# Patient Record
Sex: Female | Born: 1955 | Race: White | Hispanic: No | Marital: Married | Smoking: Former smoker
Health system: Southern US, Community
[De-identification: ages and names within clinical notes are randomized; demographics above are authoritative.]

## PROBLEM LIST (undated history)

## (undated) DIAGNOSIS — K5790 Diverticulosis of intestine, part unspecified, without perforation or abscess without bleeding: Secondary | ICD-10-CM

## (undated) DIAGNOSIS — F329 Major depressive disorder, single episode, unspecified: Secondary | ICD-10-CM

## (undated) DIAGNOSIS — G43909 Migraine, unspecified, not intractable, without status migrainosus: Secondary | ICD-10-CM

## (undated) DIAGNOSIS — R002 Palpitations: Secondary | ICD-10-CM

## (undated) DIAGNOSIS — C4491 Basal cell carcinoma of skin, unspecified: Secondary | ICD-10-CM

## (undated) DIAGNOSIS — I2510 Atherosclerotic heart disease of native coronary artery without angina pectoris: Secondary | ICD-10-CM

## (undated) DIAGNOSIS — G453 Amaurosis fugax: Secondary | ICD-10-CM

## (undated) DIAGNOSIS — K760 Fatty (change of) liver, not elsewhere classified: Secondary | ICD-10-CM

## (undated) DIAGNOSIS — E039 Hypothyroidism, unspecified: Secondary | ICD-10-CM

## (undated) DIAGNOSIS — I10 Essential (primary) hypertension: Secondary | ICD-10-CM

## (undated) DIAGNOSIS — M1990 Unspecified osteoarthritis, unspecified site: Secondary | ICD-10-CM

## (undated) DIAGNOSIS — K589 Irritable bowel syndrome without diarrhea: Secondary | ICD-10-CM

## (undated) DIAGNOSIS — F419 Anxiety disorder, unspecified: Secondary | ICD-10-CM

## (undated) DIAGNOSIS — I639 Cerebral infarction, unspecified: Secondary | ICD-10-CM

## (undated) DIAGNOSIS — F32A Depression, unspecified: Secondary | ICD-10-CM

## (undated) DIAGNOSIS — K219 Gastro-esophageal reflux disease without esophagitis: Secondary | ICD-10-CM

## (undated) HISTORY — DX: Amaurosis fugax: G45.3

## (undated) HISTORY — DX: Major depressive disorder, single episode, unspecified: F32.9

## (undated) HISTORY — DX: Anxiety disorder, unspecified: F41.9

## (undated) HISTORY — DX: Gastro-esophageal reflux disease without esophagitis: K21.9

## (undated) HISTORY — DX: Atherosclerotic heart disease of native coronary artery without angina pectoris: I25.10

## (undated) HISTORY — DX: Cerebral infarction, unspecified: I63.9

## (undated) HISTORY — DX: Palpitations: R00.2

## (undated) HISTORY — DX: Unspecified osteoarthritis, unspecified site: M19.90

## (undated) HISTORY — DX: Migraine, unspecified, not intractable, without status migrainosus: G43.909

## (undated) HISTORY — DX: Diverticulosis of intestine, part unspecified, without perforation or abscess without bleeding: K57.90

## (undated) HISTORY — PX: TUBAL LIGATION: SHX77

## (undated) HISTORY — PX: CHOLECYSTECTOMY: SHX55

## (undated) HISTORY — DX: Essential (primary) hypertension: I10

## (undated) HISTORY — DX: Basal cell carcinoma of skin, unspecified: C44.91

## (undated) HISTORY — PX: SHOULDER SURGERY: SHX246

## (undated) HISTORY — PX: CHOLECYSTECTOMY, LAPAROSCOPIC: SHX56

## (undated) HISTORY — DX: Hypothyroidism, unspecified: E03.9

## (undated) HISTORY — DX: Depression, unspecified: F32.A

## (undated) HISTORY — DX: Irritable bowel syndrome, unspecified: K58.9

---

## 1998-02-12 DIAGNOSIS — C4491 Basal cell carcinoma of skin, unspecified: Secondary | ICD-10-CM

## 1998-02-12 HISTORY — DX: Basal cell carcinoma of skin, unspecified: C44.91

## 1998-08-02 ENCOUNTER — Ambulatory Visit (HOSPITAL_COMMUNITY): Admission: RE | Admit: 1998-08-02 | Discharge: 1998-08-02 | Payer: Self-pay | Admitting: *Deleted

## 2000-02-13 HISTORY — PX: CARDIAC CATHETERIZATION: SHX172

## 2000-09-29 ENCOUNTER — Encounter: Payer: Self-pay | Admitting: *Deleted

## 2000-09-29 ENCOUNTER — Inpatient Hospital Stay (HOSPITAL_COMMUNITY): Admission: EM | Admit: 2000-09-29 | Discharge: 2000-10-03 | Payer: Self-pay | Admitting: *Deleted

## 2000-10-01 ENCOUNTER — Encounter: Payer: Self-pay | Admitting: *Deleted

## 2002-10-15 ENCOUNTER — Encounter: Payer: Self-pay | Admitting: Occupational Therapy

## 2002-10-15 ENCOUNTER — Ambulatory Visit (HOSPITAL_COMMUNITY): Admission: RE | Admit: 2002-10-15 | Discharge: 2002-10-15 | Payer: Self-pay | Admitting: Occupational Therapy

## 2005-07-12 ENCOUNTER — Ambulatory Visit (HOSPITAL_COMMUNITY): Admission: RE | Admit: 2005-07-12 | Discharge: 2005-07-12 | Payer: Self-pay | Admitting: Nurse Practitioner

## 2005-07-31 ENCOUNTER — Ambulatory Visit (HOSPITAL_COMMUNITY): Admission: RE | Admit: 2005-07-31 | Discharge: 2005-07-31 | Payer: Self-pay | Admitting: Nurse Practitioner

## 2010-02-28 ENCOUNTER — Ambulatory Visit
Admission: RE | Admit: 2010-02-28 | Discharge: 2010-02-28 | Payer: Self-pay | Source: Home / Self Care | Attending: Gastroenterology | Admitting: Gastroenterology

## 2010-02-28 ENCOUNTER — Encounter: Payer: Self-pay | Admitting: Internal Medicine

## 2010-02-28 DIAGNOSIS — R109 Unspecified abdominal pain: Secondary | ICD-10-CM | POA: Insufficient documentation

## 2010-02-28 DIAGNOSIS — R197 Diarrhea, unspecified: Secondary | ICD-10-CM | POA: Insufficient documentation

## 2010-03-02 ENCOUNTER — Encounter (INDEPENDENT_AMBULATORY_CARE_PROVIDER_SITE_OTHER): Payer: Self-pay

## 2010-03-02 ENCOUNTER — Encounter: Payer: Self-pay | Admitting: Internal Medicine

## 2010-03-03 ENCOUNTER — Ambulatory Visit (HOSPITAL_COMMUNITY): Admission: RE | Admit: 2010-03-03 | Payer: Self-pay | Source: Home / Self Care | Admitting: Internal Medicine

## 2010-03-10 ENCOUNTER — Ambulatory Visit (HOSPITAL_COMMUNITY): Admission: RE | Admit: 2010-03-10 | Payer: Self-pay | Source: Home / Self Care | Admitting: Internal Medicine

## 2010-03-13 ENCOUNTER — Telehealth (INDEPENDENT_AMBULATORY_CARE_PROVIDER_SITE_OTHER): Payer: Self-pay | Admitting: *Deleted

## 2010-03-13 ENCOUNTER — Encounter (INDEPENDENT_AMBULATORY_CARE_PROVIDER_SITE_OTHER): Payer: Self-pay | Admitting: *Deleted

## 2010-03-15 ENCOUNTER — Encounter: Payer: Self-pay | Admitting: Internal Medicine

## 2010-03-16 NOTE — Letter (Signed)
Summary: OP REPORT FROM 03/25/98  OP REPORT FROM 03/25/98   Imported By: Rexene Alberts 03/02/2010 12:12:55  _____________________________________________________________________  External Attachment:    Type:   Image     Comment:   External Document

## 2010-03-16 NOTE — Assessment & Plan Note (Addendum)
Summary: chronic diarrhea with unconfirmed ibs/ss   Visit Type:  Initial Consult Referring Provider:  Lenise Herald, PA Primary Care Provider:  Lenise Herald, PA  CC:  diarrhea.  History of Present Illness: Diamond Mills is a 55 year old Caucasian female who presents today at the request of Lenise Herald, secondary to diarrhea. She has undergone an EGD and colonoscopy by Dr. Jena Gauss in the remote past, around 2000. EGD in Jan 2000 was normal with negative CLOtest. Colonoscopy in 2000 showed anal canal/internal hemorrhoids, normal colon.   Reports chronic right-sided abdominal pain, intermittent, no gallbladder X 2 years. Feels like someone has rolled a cloth into a roll and sitting up under ribcage. Not associated with eating/drinking. Was given Cipro for three days, pain got better. Feels stress-related, which worsens pain. 8/10 when hits. lasts 2-3 days. +hx of reflux, omeprazole controls reflux. Spicy food exacerbates if eats alot. No dysphagia. Was told at [facility] that had IBS-D. Can have BM 2-6X per day. Is on Bentyl, which has helped considerably with diarrhea, but continues to have. Normally postprandially, 15-20 minutes after eating. No abdominal cramping. Has had loose stools "whole life". Has hx of hemorrhoids, notices sparse amounts of bright red blood intermittently. scant amount.   Current Medications (verified): 1)  Amlodipine Besylate 5 Mg Tabs (Amlodipine Besylate) .... Take 1 Tablet By Mouth Once A Day 2)  Lisinopril 20 Mg Tabs (Lisinopril) .... Take 1 Tablet By Mouth Once A Day 3)  Aspir-Low 81 Mg Tbec (Aspirin) .... Take 1 Tablet By Mouth Once A Day 4)  Levothyroxine Sodium 75 Mcg Tabs (Levothyroxine Sodium) .... Take 1 Tablet By Mouth Once A Day 5)  Bentyl 10 Mg Caps (Dicyclomine Hcl) .... One Tablet At Bedtime 6)  Omeprazole 20 Mg Cpdr (Omeprazole) .... One Tablet Daily  Allergies (verified): 1)  ! Penicillin 2)  ! Sulfa 3)  ! * E-Mycin 4)  ! Vancomycin 5)  ! * Ivp Dye  Past  History:  Past Medical History: HTN GERD IBS-D likely hypothyroidism hx CVA CAD: 5 heart caths hx PUD , EGD in [place] in late 1980s per medical record reports  EGD/colon in 2000: EGD normal, anal canal/internal hemorrhoids, otherwise normal  Past Surgical History: Cardiac Cath X 5 tubal sterilization cholecystectomy shoulder surgery (spur)  Family History: Mother:deceased, DM, schizophrenia, obese Father:unknown, was adopted Siblings, 2 sisters: one triple bypass, IBS, HTN No FH of Colon Cancer:  Social History: Occupation: CNA at [employer], 2 children, 1 deceased, 4 grandchildren Patient is a former smoker. quit 1991, used to smoke 2ppd.  Alcohol Use - no Illicit Drug Use - no Smoking Status:  quit Drug Use:  no  Review of Systems General:  Denies fever, chills, and anorexia. Eyes:  Denies blurring, irritation, and discharge. ENT:  Denies sore throat, hoarseness, and difficulty swallowing. CV:  Denies chest pains and syncope. Resp:  Denies dyspnea at rest and wheezing. GI:  Complains of abdominal pain and diarrhea; denies difficulty swallowing, pain on swallowing, nausea, indigestion/heartburn, constipation, change in bowel habits, bloody BM's, and black BMs. GU:  Denies urinary burning and urinary frequency. MS:  Denies joint pain / LOM, joint swelling, and joint stiffness. Derm:  Denies rash, itching, and dry skin. Neuro:  Denies weakness and syncope. Psych:  Denies depression and anxiety. Endo:  Denies cold intolerance and heat intolerance. Heme:  Denies bruising and bleeding.  Vital Signs:  Patient profile:   55 year old female Height:      65 inches Weight:  219 pounds BMI:     36.58 Temp:     98.7 degrees F oral Pulse rate:   72 / minute BP sitting:   130 / 80  (left arm) Cuff size:   large  Vitals Entered By: Cloria Spring LPN (February 28, 2010 1:58 PM)  Physical Exam  General:  Well developed, well nourished, no acute distress. Head:   Normocephalic and atraumatic. Eyes:  sclera without icterus Mouth:  No deformity or lesions, dentition normal. Neck:  Supple; no masses or thyromegaly. Lungs:  Clear throughout to auscultation. Heart:  Regular rate and rhythm; no murmurs, rubs,  or bruits. Abdomen:  +BS, non-distended, right-sided abdomen region. No rebound or guarding, no HSM noted.  Msk:  Symmetrical with no gross deformities. Normal posture. Pulses:  Normal pulses noted. Extremities:  No clubbing, cyanosis, edema or deformities noted. Neurologic:  Alert and  oriented x4;  grossly normal neurologically. Psych:  Alert and cooperative. Normal mood and affect.  Impression & Recommendations:  Problem # 1:  ABDOMINAL PAIN (ICD-789.00) Chronic right-sided abdominal pain, intermittent, no associated with eating/drinking. stress worsens discomfort. 8/10 at worst, lasts 2-3 days at at time. exacerbated by spicy foods as well. Denies reflux, which is controlled by omeprazole. Denies dysphagia. s/p chole. Diff include gastritis vs PUD, does have remote hx of PUD. Low likely hepatobiliary component. ?functional abdominal pain.    CBC, CMP [place] of abdomen EGD may need to be added after review of above.    Orders: T-CBC w/Diff ([id number]) T-Comprehensive Metabolic Panel [id number]) Consultation Level III (29562)  Problem # 2:  DIARRHEA (ICD-787.91) Long-standing hx of reportedly IBS, diarrhea predominent. Diagnosed at [facility] per pt. Currently on Bentyl, which she states helps significantly with postprandial loose stool. No abdominal cramping. hx of hemorrhois, but has noticed sparse amounts of brbpr intermittently. Last TCS in 2000 normal except internal and anal canal hemorrhoids. Likely low-volume hematochezia secondary to benign anorectal process; yet, is due for a screening colonoscopy. occult malignancy can't be excluded.   Add supplemental fiber to diet TCS with RMR: the risks, benefits, alternatives have been discussed  in detail with pt; she stated understanding, and has given verbal consent.   Orders: T-CBC w/Diff ([id number]) T-Comprehensive Metabolic Panel [phone]) Consultation Level III (24401)  Appended Document: chronic diarrhea with unconfirmed ibs/ss letter has been sent to pt regarding labs and need for ultrasound. cancelled procedure without informing our office.

## 2010-03-16 NOTE — Letter (Signed)
Summary: TCS ORDERS  TCS ORDERS   Imported By: Rexene Alberts 02/28/2010 15:55:24  _____________________________________________________________________  External Attachment:    Type:   Image     Comment:   External Document

## 2010-03-16 NOTE — Letter (Signed)
Summary: REFERRAL FROM [facility]  REFERRAL FROM [facility]   Imported By: Rexene Alberts 03/02/2010 13:48:36  _____________________________________________________________________  External Attachment:    Type:   Image     Comment:   External Document

## 2010-03-16 NOTE — Letter (Addendum)
Summary: ABD U/S ORDER  ABD U/S ORDER   Imported By: Ave Filter 02/28/2010 15:15:54  _____________________________________________________________________  External Attachment:    Type:   Image     Comment:   External Document  Appended Document: ABD U/S ORDER Pt NO SHOWED for her u/s...  Appended Document: ABD U/S ORDER I see she did not complete her colonoscopy, either. We need to send her a letter to not neglect her health.   Appended Document: ABD U/S ORDER Mailed letter to pt.

## 2010-03-16 NOTE — Letter (Signed)
Summary: Plan of Care, Need to Discuss  [facility]  [address]   [place], [place] [address]   Phone: [phone]  Fax: [phone]    March 02, 2010  Diamond Mills [address], [place]  [address] May 04, 1955   Dear Ms. Diamond Mills,   We received a call from the hospital that you had called to cancel your  appointment on 03/10/2009 @ 9:00 AM.  We would like for you to call our office and let us know, since that is the way we handle cancellations. I was unable to call you at the  phone number listed on your chart. Please call us at [phone] and let us know what your plans are. Look forward to hearing from you soon.  Please do not neglect your health.   Sincerely,    Cloria Spring LPN  [facility] Ph: [phone]    Fax: [phone]

## 2010-03-22 NOTE — Progress Notes (Signed)
Summary: pt was a no show for her tcs  Phone Note From Other Clinic   Summary of Call: Shawna Orleans from Short Stay called this morning to let us know that pt do not show up for her TCS on Friday 03/10/10 and that the home number was disconnected.   Initial call taken by: Diana Eves,  March 13, 2010 9:38 AM     Appended Document: pt was a no show for her tcs I tried to call pt, phone number listed is disconnected..I mailed letter to pt.

## 2010-03-22 NOTE — Letter (Signed)
Summary: Radiology Test Reminder  [facility]  [address]   [place], [place] [address]   Phone: [phone]  Fax: [phone]     March 13, 2010   Diamond Mills [address] [place], [place]  [address] 12-Aug-1955  Dear Ms. Diamond Mills,  During your last appointment, your doctor requested you have a Colonoscopy.  Our records indicate you have not had this done.  Remember it is very important to follow your doctor's instructions.  Please have this done as soon as possible.  If you have questions regarding this appointment, please call our office and we can reschedule this for you.  It is important that patients and their doctor work together in the management and treatment of their health care.  If you have already had your test done, please disregard this letter.  Thank you,    Ave Filter  [facility] Ph: [phone]   Fax: [phone]

## 2010-04-17 ENCOUNTER — Other Ambulatory Visit (HOSPITAL_COMMUNITY): Payer: Self-pay | Admitting: Internal Medicine

## 2010-04-17 ENCOUNTER — Ambulatory Visit (HOSPITAL_COMMUNITY)
Admission: RE | Admit: 2010-04-17 | Discharge: 2010-04-17 | Disposition: A | Discharge status: Home / Self Care | Payer: 59 | Source: Ambulatory Visit | Attending: Internal Medicine | Admitting: Internal Medicine

## 2010-04-17 DIAGNOSIS — S298XXA Other specified injuries of thorax, initial encounter: Secondary | ICD-10-CM | POA: Insufficient documentation

## 2010-04-17 DIAGNOSIS — R0789 Other chest pain: Secondary | ICD-10-CM | POA: Insufficient documentation

## 2010-04-17 DIAGNOSIS — W19XXXA Unspecified fall, initial encounter: Secondary | ICD-10-CM | POA: Insufficient documentation

## 2010-04-17 DIAGNOSIS — Q766 Other congenital malformations of ribs: Secondary | ICD-10-CM

## 2010-04-25 ENCOUNTER — Encounter (INDEPENDENT_AMBULATORY_CARE_PROVIDER_SITE_OTHER): Payer: Self-pay | Admitting: *Deleted

## 2010-05-02 NOTE — Letter (Signed)
Summary: Radiology Test Reminder  [facility]  [address]   [place], [place] [address]   Phone: [phone]  Fax: [phone]     April 25, 2010   Diamond Mills [address], [place]  [address] [birth year]  Dear Diamond Mills,  During your last appointment, your doctor requested you have an Ultrasound.  Our records indicate you have not had this done.  Remember it is very important to follow your doctor's instructions.  Please have this done as soon as possible.  If you have questions regarding this appointment, please call our office and we can reschedule this for you.  It is important that patients and their doctor work together in the management and treatment of their health care.  If you have already had your test done, please disregard this letter.  Thank you,    Ave Filter  [facility] Ph: [phone]   Fax: [phone]

## 2010-06-30 NOTE — Cardiovascular Report (Signed)
[place]. [facility]  Patient:    Diamond Mills, Diamond Mills Visit Number: [id number] MRN: [id number]          Service Type: MED Location: [phone] Attending Physician:  Nelta Numbers Proc. Date: 10/02/00 Adm. Date:  09/29/2000   CC:         Kari Baars, M.D., Angelita Ingles C. Wall, M.D. [facility]   Cardiac Catheterization  PROCEDURES PERFORMED: 1. Left heart catheterization. 2. Left ventriculogram. 3. Selective coronary angiography.  DIAGNOSES: 1. Mild coronary artery disease by angiogram. 2. Normal left ventricular systolic function.  INDICATIONS:  The patient is a 55 year old, white female, who presents with substernal chest discomfort.  The patient was admitted to the hospital and subsequently ruled out for acute myocardial infarction.  She underwent a stress imaging study during which time she had chest discomfort.  Imaging study showed no ischemia.  Due to persistent symptoms, she presents for cardiac catheterization.  TECHNIQUE:  After informed consent was obtained, the patient was brought to the cardiac catheterization lab.  A 6 French sheath was placed in the right femoral artery.  Left heart catheterization and selective angiography were then performed in the usual fashion using preformed 6 French Judkins catheters.  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 At the termination of the case, the catheters and sheath were removed and manual pressure applied until adequate hemostasis was achieved.  The patient tolerated the procedure well and was transferred to the ward in stable condition.  FINDINGS:  Findings are as follows: 1. Left main trunk:  The left main trunk is a large caliber vessel with    mild irregularities. 2. LAD:  This is a large caliber vessel that provides the major first    diagonal branch in the proximal segment.  The  LAD proper has mild    irregularities.  The first diagonal branch has an ostial narrowing of    50-60% with a further narrowing of 50% in the proximal segment. 3. Left circumflex artery:  The left circumflex artery is a large caliber    vessel that provides two marginal branches in the mid section.  Left    circumflex system has mild irregularities. 4. Ramus intermedius:  This is a medium caliber vessel with mild    irregularities. 5. Right coronary artery:  The right coronary artery is dominant. This is a    medium caliber vessel that provides a small posterior descending and    posterior ventricular branch in its terminal segment.  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  It has    mild narrowing of 30% in the proximal segment.  The remainder of the    vessel is free of significant disease.  LEFT VENTRICULOGRAM:  Normal end-systolic and end-diastolic dimensions. Overall left ventricular function is well preserved, ejection fraction of greater than 55%.  No mitral regurgitation.  LV pressure is 180/0, aortic is 180/90, LVEDP equals 30.  ASSESSMENT AND PLAN:  The patient is a 55 year old female, who presents with noncritical coronary artery disease.  Review of catheterization films from August 01, 2000, shows no progression of disease in the diagonal branch.  At this point continued medical therapy will be recommended. Attending Physician:  Nelta Numbers DD:  10/02/00 TD:  10/03/00 Job: [id number]

## 2010-06-30 NOTE — Consult Note (Signed)
[place]. [facility]  Patient:    Diamond Mills, Diamond Mills Visit Number: [id number] MRN: [id number]          Service Type: MED Location: [phone] Attending Physician:  Nelta Numbers Dictated by:   Kelli Hope, M.D. Proc. Date: 10/03/00 Adm. Date:  09/29/2000 Disc. Date: 10/03/2000   CC:         Jesse Sans. Wall, M.D. [facility]   Consultation Report  DATE OF BIRTH:  March 08, 1955  REQUESTING PHYSICIAN:  Jesse Sans. Wall, M.D. [facility]  REASON FOR EVALUATION:  Left eye visual loss.  HISTORY OF PRESENT ILLNESS:  This is the initial inpatient consultation evaluation of this 55 year old woman with a past medical history including hypertension, who was admitted on August 18 for an episode of chest pain.  She had a negative stress Cardiolite but had an episode of nonsustained ventricular tachycardia during her hospitalization and subsequently underwent diagnostic cardiac catheterization yesterday which showed only minimal cardiac disease.  The patient reports that she vaguely remembers rubbing her left eye after the procedure as if something was wrong with it, although she was not really aware of any visual changes until sometime after the procedure when her sedation wore off.  At that point, she noted that she seemed to have a "grey spot" in her left eye which blocked out her center of vision.  She has had a little bit of improvement in this today in that the spot is a little smaller, and the grey is now a pinkish color.  She is also able to perceive some light in this are.  The peripheral vision is fine.  She has no difficulty seeing out of the right eye.  She notes that she sees a little better out of the left eye when she lies supine.  For what looked like embolic event, her heparin was restarted last night.  She denies ever having any similar events before and to her knowledge has never had a stroke.  PAST MEDICAL HISTORY:  Remarkable for hypertension  which has been well controlled in the hospital.  No known history of diabetes, stroke, or coronary artery disease.  She did have mild one-vessel disease on previous outside catheterization that was confirmed on the study yesterday.  FAMILY HISTORY:  Mother died at 78 of heart disease.  She has two daughters, one of which has epilepsy.  SOCIAL HISTORY:  She is married and lives with her husband.  She is normally independent in her activities of daily living.  She has not smoked in 10 years.  ALLERGIES:  No known drug allergies.  MEDICATIONS AT HOME:  Aceon, Toprol, aspirin, and nitroglycerin.  MEDICATIONS IN HOSPITAL:  She is also receiving Colace, Protonix, potassium, Pepcid, and heparin; and the Aceon has been changed to Norvasc.  REVIEW OF SYSTEMS:  Per HPI.  There was no associated dysarthria or dysphasia. There were no weakness or sensory changes in the extremities associated with the above event.  PHYSICAL EXAMINATION:  VITAL SIGNS:  Temperature 96.8, blood pressure 121/60, pulse 65, respirations 20.  GENERAL:  She is in no evident distress.  NEUROLOGIC:  Mental Status: She is awake, alert, and completely oriented. Mood is euthymic, and affect is appropriate.  Speech is fluent and not dysarthric.  Cranial Nerves:  Funduscopic exam is benign.  Pupils are equal and briskly reactive, and there is no afferent pupillary defect.  Extraocular movements are normal without nystagmus.  Visual fields tested independently in each eye reveal  on the left side only a central scotoma involving more of the inferior than superior field.  Peripheral vision is intact.  Facial sensation is intact to pinprick.  Face, tongue, and palate all move normally in the midline.  Motor Exam shows normal bulk and tone.  Normal strength in all tested extremity muscles.  Sensation is intact to pinprick and light touch in all extremities.  Reflexes are 2+ and symmetric.  Toes are  downgoing. Finger-to-nose is performed full and well.  IMPRESSION:  Left eye visual loss secondary to a vascular event, almost certainly an embolic event related to cardiac catheterization.  She has had some improvement since yesterday.  Need to rule out carotid disease.  RECOMMENDATIONS: 1. Check carotid Dopplers before discharge. 2. Okay to discontinue heparin. 3. Would let blood pressure be a little elevated over the next one to two    weeks, especially as long as she notices a postural difference in her    vision. 4. May patch eye for symptomatic relief as needed, although not all the time. 5. Outpatient ophthalmology followup with serial visual field testing would    be helpful.  She is likely to have improvement, although she may never    regain her central vision completely.  Thank you for the consult. Dictated by:   Kelli Hope, M.D. Attending Physician:  Nelta Numbers DD:  10/03/00 TD:  10/04/00 Job: [id number]

## 2010-06-30 NOTE — Discharge Summary (Signed)
[place]. [facility]  Patient:    Diamond Mills, Diamond Mills Visit Number: [id number] MRN: [id number]          Service Type: Attending:  Gerrit Friends. Dietrich Pates, M.D. [facility] Dictated by:   Rozell Searing, P.A. Adm. Date:  09/29/00 Disc. Date: 10/04/00   CC:         Diamond Mills, M.D.  Diamond Mills, M.D.   Referring Physician Discharge [facility]  PROCEDURES: 1. Exercise Cardiolite on October 01, 2000. 2. Cardiac catheterization on October 02, 2000. 3. Carotid Dopplers on October 03, 2000.  REASON FOR ADMISSION:  The patient is a 55 year old female with a history of nonobstructive CAD, who presented with complaint of chest pain with bilateral arm radiation.  She was admitted for rule out of MI and further diagnostic evaluation.  LABORATORY DATA:  Cardiac enzymes:  CPK-MB negative x 3, troponin I 0.01 x 2. Lipid profile:  Total cholesterol 153, triglycerides 130, HDL 43, LDL 84, total cholesterol/HDL ratio 3.6.  Metabolic profile notable for low potassium of 3.3 - follow-up 4.4.  Normal renal function.  Mildly decreased albumin 3.3. Normal liver enzymes.  Normal magnesium.  Normal serial CBCs.  Admission CXR:  No acute disease.  HOSPITAL COURSE:  The patient ruled out for MI with negative serial cardiac enzymes.  The initial plan was to proceed with noninvasive stress testing. The patient underwent adenosine Cardiolite testing which revealed mild septal hypokinesis with no evidence of ischemia; EF 52%.  However, serial EKG tracings did reveal inferolateral ST depression. Following review with E. Graceann Congress, M.D., the recommendation was to proceed with coronary angiography.  Cardiac catheterization performed on October 02, 2000, by Veneda Melter, M.D., (see catheterization report for full details) revealed nonobstructive CAD with normal LV function.  Specifically, there was ostial 50-60% ostial, 50-60% proximal DX1, mild OM disease, mild ramus intermedius disease, and 30%  proximal RCA (dominant).  LV function normal with no mitral regurgitation.  Veneda Melter, M.D., concluded that the chest pain was noncardiac in origin and recommended continued medical therapy.  However, following the intervention, the patient reported left eye visual loss.  She was kept on heparin pending evaluation by neurology.  The patient was seen by Diamond Mills, M.D., who evaluated carotid Dopplers, which showed no significant ICA stenosis.  He agreed to discontinue the heparin and recommended maintaining the blood pressure a little on the high side over the next one to two weeks, especially as long as she notes postural differences, he stated.  He also recommended that the patient see an ophthalmologist in follow-up.  Following this evaluation, the patient was cleared for discharge.  Of note, review of telemetry revealed an 11-beat run of wide complex tachycardia at approximately 160 bpm.  Both the potassium and magnesium were within normal limits by the time of discharge.  MEDICATIONS AT DISCHARGE: 1. Aceon 4 mg q.d. 2. Toprol XL 50 mg q.d. 3. Coated aspirin 325 mg q.d. 4. Nitro-Dur patch 0.2 mg/hr q.d. as directed. 5. Norvasc 5 mg q.d. 6. Protonix 40 mg q.d. 7. Nitrostat p.r.n.  DISCHARGE INSTRUCTIONS:  The patient is instructed to schedule a follow-up appointment with Maisie Fus C. Wall, M.D., in six months.  She is also encouraged to proceed with an outpatient ophthalmology follow-up as ordered per neurology.  DISCHARGE DIAGNOSES: 1. Noncardiac chest pain.    a. Negative serial cardiac enzymes.    b. Mild coronary artery disease/normal left ventricular function by cardiac       catheterization on October 02, 2000.  c. Post catheterization left eye visual loss.  Negative evaluation by       neurology. 2. Nonsustained ventricular tachycardia 3. History of hypertension. 4. Remote history of peptic ulcer disease. Dictated by:   Rozell Searing, P.A. Attending:  Gerrit Friends. Dietrich Pates, M.D. [facility] DD:  10/03/00 TD:  10/04/00 Job: [id number]

## 2010-11-16 ENCOUNTER — Ambulatory Visit (INDEPENDENT_AMBULATORY_CARE_PROVIDER_SITE_OTHER): Payer: 59 | Admitting: Gastroenterology

## 2010-11-16 ENCOUNTER — Encounter: Payer: Self-pay | Admitting: Gastroenterology

## 2010-11-16 VITALS — BP 140/86 | HR 66 | Temp 97.6°F | Ht 65.0 in | Wt 206.0 lb

## 2010-11-16 DIAGNOSIS — R109 Unspecified abdominal pain: Secondary | ICD-10-CM

## 2010-11-16 DIAGNOSIS — R197 Diarrhea, unspecified: Secondary | ICD-10-CM

## 2010-11-16 NOTE — Progress Notes (Signed)
Referring Provider: No ref. provider found Primary Care Physician:  Cassell Smiles., MD Primary Gastroenterologist: Dr. Jena Gauss   Chief Complaint  Patient presents with  . Abdominal Pain    on the right side  . Rectal Bleeding    couple of days ago  . Dizziness    very weak    HPI:   Diamond Mills is a 55 year old female who was last seen by myself in Jan 2012 secondary to diarrhea and chronic right-sided abdominal pain. She was set up for an [place] of abdomen, colonoscopy, and labs to include CBC and CMP. Her last EGD and colonoscopy procedures were in 2000. She states she went home and her husband told her she couldn't proceed with the work-up due to finances.   She presents today down 13 lbs since Jan 2012. She reports RUQ pain X at least one year, which seems to be worsening. This is described as constant, underlying dull ache. She does note increase in pain after eating, approximately one hour later. Denies fever or chills, NSAIDs or aspirin powders.  Intermittent nausea associated. Takes Prilosec for GERD, no dysphagia or odynophagia. Reports loss of appetite.   She also reports intermittent postprandial loose stools, as well as diarrhea worsening Wed through Sunday of last week. She took Kaopectate on Sunday, and noted black stools afterward. No hematochezia noted. She works as a Lawyer with the elderly.    No changes in meds. Was on abx secondary to bronchitis/kidney infection last 2 months.  She is worried about Cdiff.   Past Medical History  Diagnosis Date  . HTN (hypertension)   . GERD (gastroesophageal reflux disease)   . IBS (irritable bowel syndrome)   . Hypothyroidism   . Cerebrovascular accident   . PUD (peptic ulcer disease)     History; EGD in [place] in late 80's  . S/P endoscopy 2000    normal  . S/P colonoscopy 2000    anal canal/internal hemorrhoids, otherwise normal  . CAD (coronary artery disease)     cardiac cath X 5    Past Surgical History  Procedure Date    . Cardiac catheterization     times 5  . Tubal ligation   . Cholecystectomy   . Shoulder surgery     Spur    Current Outpatient Prescriptions  Medication Sig Dispense Refill  . amLODipine (NORVASC) 5 MG tablet Take 5 mg by mouth daily.        Marland Kitchen aspirin 81 MG tablet Take 81 mg by mouth daily.        Marland Kitchen levothyroxine (SYNTHROID, LEVOTHROID) 75 MCG tablet Take 75 mcg by mouth daily.        Marland Kitchen lisinopril (PRINIVIL,ZESTRIL) 20 MG tablet Take 20 mg by mouth daily.        Marland Kitchen omeprazole (PRILOSEC) 20 MG capsule Take 20 mg by mouth daily.        Marland Kitchen dicyclomine (BENTYL) 10 MG capsule Take 10 mg by mouth 4 (four) times daily -  before meals and at bedtime.          Allergies as of 11/16/2010 - Review Complete 11/16/2010  Allergen Reaction Noted  . Penicillins Hives   . Sulfonamide derivatives Other (See Comments)   . Vancomycin Palpitations     Family History  Problem Relation Age of Onset  . Colon cancer Neg Hx     History   Social History  . Marital Status: Single    Spouse Name: N/A    Number of  Children: N/A  . Years of Education: N/A   Social History Main Topics  . Smoking status: Former Smoker -- 2.0 packs/day    Types: Cigarettes  . Smokeless tobacco: Former Neurosurgeon    Quit date: 02/15/1989  . Alcohol Use: No  . Drug Use: No  . Sexually Active: None    Review of Systems: Gen: Denies fever, chills, + loss of appetite. . +fatigue, weakness, weight loss (13 lbs).  CV: Denies chest pain, palpitations, syncope, peripheral edema, and claudication. Resp: Denies dyspnea at rest, cough, wheezing, coughing up blood, and pleurisy. GI: Denies vomiting blood, jaundice, and fecal incontinence.   Denies dysphagia or odynophagia. Derm: Denies rash, itching, dry skin Psych: Denies depression, anxiety, memory loss, confusion. No homicidal or suicidal ideation.  Heme: Denies bruising, bleeding, and enlarged lymph nodes.  Physical Exam: BP 140/86  Pulse 66  Temp(Src) 97.6 F (36.4 C)  (Temporal)  Ht 5\' 5"  (1.651 m)  Wt 206 lb (93.441 kg)  BMI 34.28 kg/m2 General:   Alert and oriented. No distress noted. Pleasant and cooperative.  Head:  Normocephalic and atraumatic. Eyes:  Conjuctiva clear without scleral icterus. Mouth:  Oral mucosa pink and moist. Good dentition. No lesions. Neck:  Supple, without mass or thyromegaly. Heart:  S1, S2 present without murmurs, rubs, or gallops. Regular rate and rhythm. Abdomen:  +BS, soft and non-distended. Mildly tender to palpation right-sided abdomen. No rebound or guarding. No HSM or masses noted. Msk:  Symmetrical without gross deformities. Normal posture. Extremities:  Without edema. Neurologic:  Alert and  oriented x4;  grossly normal neurologically. Skin:  Intact without significant lesions or rashes. Cervical Nodes:  No significant cervical adenopathy. Psych:  Alert and cooperative. Normal mood and affect.

## 2010-11-16 NOTE — Patient Instructions (Signed)
Please follow the low-residue diet for now until we receive the results of the labs and stool studies.  Please complete the labs and stool studies.  Start taking a Probiotic daily. This will help with balance of your digestive system.  We will be contacting you with the results of these tests and the timing of the endoscopy and colonoscopy.

## 2010-11-17 LAB — CBC WITH DIFFERENTIAL/PLATELET
Basophils Relative: 1 % (ref 0–1)
Eosinophils Absolute: 0.2 10*3/uL (ref 0.0–0.7)
Hemoglobin: 13.1 g/dL (ref 12.0–15.0)
MCH: 30.5 pg (ref 26.0–34.0)
MCHC: 33.1 g/dL (ref 30.0–36.0)
Monocytes Absolute: 0.8 10*3/uL (ref 0.1–1.0)
Monocytes Relative: 12 % (ref 3–12)
Neutrophils Relative %: 37 % — ABNORMAL LOW (ref 43–77)

## 2010-11-17 LAB — HEPATIC FUNCTION PANEL
Alkaline Phosphatase: 81 U/L (ref 39–117)
Indirect Bilirubin: 0.7 mg/dL (ref 0.0–0.9)
Total Protein: 6.9 g/dL (ref 6.0–8.3)

## 2010-11-17 LAB — LIPASE: Lipase: 25 U/L (ref 0–75)

## 2010-11-19 LAB — FECAL LACTOFERRIN, QUANT: Lactoferrin: POSITIVE

## 2010-11-20 ENCOUNTER — Encounter: Payer: Self-pay | Admitting: Gastroenterology

## 2010-11-20 LAB — GIARDIA ANTIGEN: Giardia Screen (EIA): NEGATIVE

## 2010-11-20 NOTE — Assessment & Plan Note (Addendum)
56 year old female with long-standing hx of reported IBS, likely diarrhea predominant. Last TCS in 2000. Not noted above, had reported low-volume hematochezia in Jan, but she did not follow through with procedures as requested due to finances (and husband's direction). She recently had a severe bout with diarrhea last Wed through Sunday, noting black stools on Sunday after taking Kaopectate. She has also lost approximately 13 lbs since visit in Jan, with lack of appetite. 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 Likely isolated incidence of black stools secondary to Kaopectate. We will do the following:  Stool Studies: culture, Cdiff PCR, Giardia, lactoferrin Low-residue diet TTG, IgA TSH CBC, Lipase, HFP (as mentioned under abdominal pain) Probiotic daily After review of above, proceed with TCS. Proceed with TCS with Dr. Jena Gauss in near future: the risks, benefits, and alternatives have been discussed with the patient in detail. The patient states understanding and desires to proceed.

## 2010-11-20 NOTE — Assessment & Plan Note (Addendum)
Chronic right-sided abdominal pain, underlying dull ache, associated nausea, worsening approximately 1 hour after eating/drinking. Omeprazole daily. Hx of PUD in 26s. Status post cholecystectomy several years ago. Etiology unclear at this time; EGD warranted due to lack of appetite, nausea, wt loss. Need updated labs as well to include:  CBC, HFP, Lipase Consider [place] of abd if abnormalities above Proceed with EGD at time of colonoscopy. Proceed with upper endoscopy in the near future with Dr. Jena Gauss. The risks, benefits, and alternatives have been discussed in detail with patient. They have stated understanding and desire to proceed.

## 2010-11-21 ENCOUNTER — Telehealth: Payer: Self-pay | Admitting: Gastroenterology

## 2010-11-21 NOTE — Progress Notes (Signed)
Quick Note:  So far, labs have returned normal. Negative Cdiff. We need to proceed with a colonoscopy in the near future. NO need to bring patient back in, as I saw her just last week. ______

## 2010-11-21 NOTE — Telephone Encounter (Signed)
Reviewed labs, note under labs.

## 2010-11-21 NOTE — Progress Notes (Signed)
Cc to PCP 

## 2010-11-21 NOTE — Telephone Encounter (Signed)
PT CALLED WANTING HER LAB RESULTS- SHE CAN BE REACHED AT [phone]

## 2010-11-21 NOTE — Telephone Encounter (Signed)
Routed to AS 

## 2010-11-22 ENCOUNTER — Encounter: Payer: Self-pay | Admitting: Gastroenterology

## 2010-11-22 ENCOUNTER — Other Ambulatory Visit: Payer: Self-pay | Admitting: Gastroenterology

## 2010-11-22 DIAGNOSIS — R197 Diarrhea, unspecified: Secondary | ICD-10-CM

## 2010-11-22 DIAGNOSIS — R634 Abnormal weight loss: Secondary | ICD-10-CM

## 2010-11-22 DIAGNOSIS — R110 Nausea: Secondary | ICD-10-CM

## 2010-11-22 LAB — STOOL CULTURE

## 2010-11-22 NOTE — Progress Notes (Signed)
Pt Scheduled for TCS/EGD on 12/11/10- instructions mailed

## 2010-11-23 ENCOUNTER — Ambulatory Visit: Payer: 59 | Admitting: Internal Medicine

## 2010-12-08 MED ORDER — SODIUM CHLORIDE 0.45 % IV SOLN
Freq: Once | INTRAVENOUS | Status: AC
  Administered 2010-12-11: 11:00:00 via INTRAVENOUS

## 2010-12-11 ENCOUNTER — Encounter (HOSPITAL_COMMUNITY)
Admission: RE | Disposition: A | Discharge status: Home / Self Care | Payer: Self-pay | Source: Ambulatory Visit | Attending: Internal Medicine

## 2010-12-11 ENCOUNTER — Ambulatory Visit (HOSPITAL_COMMUNITY)
Admission: RE | Admit: 2010-12-11 | Discharge: 2010-12-11 | Disposition: A | Discharge status: Home / Self Care | Payer: 59 | Source: Ambulatory Visit | Attending: Internal Medicine | Admitting: Internal Medicine

## 2010-12-11 ENCOUNTER — Other Ambulatory Visit: Payer: Self-pay | Admitting: Internal Medicine

## 2010-12-11 ENCOUNTER — Encounter (HOSPITAL_COMMUNITY): Payer: Self-pay | Admitting: *Deleted

## 2010-12-11 DIAGNOSIS — R109 Unspecified abdominal pain: Secondary | ICD-10-CM

## 2010-12-11 DIAGNOSIS — K449 Diaphragmatic hernia without obstruction or gangrene: Secondary | ICD-10-CM | POA: Insufficient documentation

## 2010-12-11 DIAGNOSIS — K5730 Diverticulosis of large intestine without perforation or abscess without bleeding: Secondary | ICD-10-CM | POA: Insufficient documentation

## 2010-12-11 DIAGNOSIS — I10 Essential (primary) hypertension: Secondary | ICD-10-CM | POA: Insufficient documentation

## 2010-12-11 DIAGNOSIS — R197 Diarrhea, unspecified: Secondary | ICD-10-CM

## 2010-12-11 DIAGNOSIS — R110 Nausea: Secondary | ICD-10-CM

## 2010-12-11 DIAGNOSIS — R634 Abnormal weight loss: Secondary | ICD-10-CM

## 2010-12-11 HISTORY — PX: COLONOSCOPY: SHX174

## 2010-12-11 HISTORY — PX: ESOPHAGOGASTRODUODENOSCOPY: SHX1529

## 2010-12-11 SURGERY — COLONOSCOPY WITH ESOPHAGOGASTRODUODENOSCOPY (EGD)
Anesthesia: Moderate Sedation

## 2010-12-11 MED ORDER — BUTAMBEN-TETRACAINE-BENZOCAINE 2-2-14 % EX AERO
INHALATION_SPRAY | CUTANEOUS | Status: DC | PRN
  Administered 2010-12-11: 2 via TOPICAL

## 2010-12-11 MED ORDER — STERILE WATER FOR IRRIGATION IR SOLN
Status: DC | PRN
  Administered 2010-12-11: 11:00:00

## 2010-12-11 MED ORDER — MEPERIDINE HCL 100 MG/ML IJ SOLN
INTRAMUSCULAR | Status: DC | PRN
  Administered 2010-12-11: 50 mg via INTRAVENOUS
  Administered 2010-12-11 (×4): 25 mg via INTRAVENOUS

## 2010-12-11 MED ORDER — MIDAZOLAM HCL 5 MG/5ML IJ SOLN
INTRAMUSCULAR | Status: DC | PRN
  Administered 2010-12-11 (×3): 1 mg via INTRAVENOUS
  Administered 2010-12-11: 2 mg via INTRAVENOUS

## 2010-12-11 MED ORDER — MIDAZOLAM HCL 5 MG/5ML IJ SOLN
INTRAMUSCULAR | Status: AC
  Filled 2010-12-11: qty 10

## 2010-12-11 MED ORDER — MEPERIDINE HCL 100 MG/ML IJ SOLN
INTRAMUSCULAR | Status: AC
  Filled 2010-12-11: qty 2

## 2010-12-11 NOTE — H&P (Signed)
Gerrit Halls, NP 11/20/2010 5:02 PM Signed  Referring Provider: No ref. provider found  Primary Care Physician: Cassell Smiles., MD  Primary Gastroenterologist: Dr. Jena Gauss  Chief Complaint   Patient presents with   .  Abdominal Pain     on the right side   .  Rectal Bleeding     couple of days ago   .  Dizziness     very weak    HPI:  Ms. Crosby is a 55 year old female who was last seen by myself in Jan 2012 secondary to diarrhea and chronic right-sided abdominal pain. She was set up for an [place] of abdomen, colonoscopy, and labs to include CBC and CMP. Her last EGD and colonoscopy procedures were in 2000. She states she went home and her husband told her she couldn't proceed with the work-up due to finances.  She presents today down 13 lbs since Jan 2012. She reports RUQ pain X at least one year, which seems to be worsening. This is described as constant, underlying dull ache. She does note increase in pain after eating, approximately one hour later. Denies fever or chills, NSAIDs or aspirin powders. Intermittent nausea associated. Takes Prilosec for GERD, no dysphagia or odynophagia. Reports loss of appetite.  She also reports intermittent postprandial loose stools, as well as diarrhea worsening Wed through Sunday of last week. She took Kaopectate on Sunday, and noted black stools afterward. No hematochezia noted. 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 Was on abx secondary to bronchitis/kidney infection last 2 months.  She is worried about Cdiff.  Past Medical History   Diagnosis  Date   .  HTN (hypertension)    .  GERD (gastroesophageal reflux disease)    .  IBS (irritable bowel syndrome)    .  Hypothyroidism    .  Cerebrovascular accident    .  PUD (peptic ulcer disease)      History; EGD in [place] in late 80's   .  S/P endoscopy  2000     normal   .  S/P colonoscopy  2000     anal canal/internal hemorrhoids, otherwise normal   .  CAD (coronary artery disease)      cardiac cath X 5    Past Surgical History   Procedure  Date   .  Cardiac catheterization      times 5   .  Tubal ligation    .  Cholecystectomy    .  Shoulder surgery      Spur    Current Outpatient Prescriptions   Medication  Sig  Dispense  Refill   .  amLODipine (NORVASC) 5 MG tablet  Take 5 mg by mouth daily.     Marland Kitchen  aspirin 81 MG tablet  Take 81 mg by mouth daily.     Marland Kitchen  levothyroxine (SYNTHROID, LEVOTHROID) 75 MCG tablet  Take 75 mcg by mouth daily.     Marland Kitchen  lisinopril (PRINIVIL,ZESTRIL) 20 MG tablet  Take 20 mg by mouth daily.     Marland Kitchen  omeprazole (PRILOSEC) 20 MG capsule  Take 20 mg by mouth daily.     Marland Kitchen  dicyclomine (BENTYL) 10 MG capsule  Take 10 mg by mouth 4 (four) times daily - before meals and at bedtime.      Allergies as of 11/16/2010 - Review Complete 11/16/2010   Allergen  Reaction  Noted   .  Penicillins  Hives    .  Sulfonamide  derivatives  Other (See Comments)    .  Vancomycin  Palpitations     Family History   Problem  Relation  Age of Onset   .  Colon cancer  Neg Hx     History    Social History   .  Marital Status:  Single     Spouse Name:  N/A     Number of Children:  N/A   .  Years of Education:  N/A    Social History Main Topics   .  Smoking status:  Former Smoker -- 2.0 packs/day     Types:  Cigarettes   .  Smokeless tobacco:  Former Neurosurgeon     Quit date:  02/15/1989   .  Alcohol Use:  No   .  Drug Use:  No   .  Sexually Active:  None   Review of Systems:  Gen: Denies fever, chills, + loss of appetite. . +fatigue, weakness, weight loss (13 lbs).  CV: Denies chest pain, palpitations, syncope, peripheral edema, and claudication.  Resp: Denies dyspnea at rest, cough, wheezing, coughing up blood, and pleurisy.  GI: Denies vomiting blood, jaundice, and fecal incontinence. Denies dysphagia or odynophagia.  Derm: Denies rash, itching, dry skin  Psych: Denies depression, anxiety, memory loss, confusion. No homicidal or suicidal ideation.  Heme: Denies  bruising, bleeding, and enlarged lymph nodes.  Physical Exam:  BP 140/86  Pulse 66  Temp(Src) 97.6 F (36.4 C) (Temporal)  Ht 5\' 5"  (1.651 m)  Wt 206 lb (93.441 kg)  BMI 34.28 kg/m2  General: Alert and oriented. No distress noted. Pleasant and cooperative.  Head: Normocephalic and atraumatic.  Eyes: Conjuctiva clear without scleral icterus.  Mouth: Oral mucosa pink and moist. Good dentition. No lesions.  Neck: Supple, without mass or thyromegaly.  Heart: S1, S2 present without murmurs, rubs, or gallops. Regular rate and rhythm.  Abdomen: +BS, soft and non-distended. Mildly tender to palpation right-sided abdomen. No rebound or guarding. No HSM or masses noted.  Msk: Symmetrical without gross deformities. Normal posture.  Extremities: Without edema.  Neurologic: Alert and oriented x4; grossly normal neurologically.  Skin: Intact without significant lesions or rashes.  Cervical Nodes: No significant cervical adenopathy.  Psych: Alert and cooperative. Normal mood and affect.   [place] Score 11/21/2010 12:27 PM Signed  Cc to PCP Gerrit Halls, NP 11/21/2010 4:24 PM Signed  Quick Note:  So far, labs have returned normal. Negative Cdiff. We need to proceed with a colonoscopy in the near future. NO need to bring patient back in, as I saw her just last week.  ______ Cherene Julian [facility] 11/22/2010 8:44 AM Signed  Pt Scheduled for TCS/EGD on 12/11/10- instructions mailed  DIARRHEA - Gerrit Halls, NP 11/20/2010 5:00 PM Addendum  55 year old female with long-standing hx of reported IBS, likely diarrhea predominant. Last TCS in 2000. Not noted above, had reported low-volume hematochezia in Jan, but she did not follow through with procedures as requested due to finances (and husband's direction). She recently had a severe bout with diarrhea last Wed through Sunday, noting black stools on Sunday after taking Kaopectate. She has also lost approximately 13 lbs since visit in Jan, with lack of appetite. 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 Likely isolated incidence of black stools secondary to Kaopectate. We will do  the following:  Stool Studies: culture, Cdiff PCR, Giardia, lactoferrin  Low-residue diet  TTG, IgA  TSH  CBC, Lipase, HFP (as mentioned under abdominal pain)  Probiotic daily  After review of above, proceed with TCS.  Proceed with TCS with Dr. Jena Gauss in near future: the risks, benefits, and alternatives have been     discussed with the patient in detail. The patient states understanding and desires to proceed I have seen & examined the patient prior to the procedure(s) today and reviewed the history and physical/consultation.  There have been no changes.  After consideration of the risks, benefits, alternatives and imponderables, the patient has consented to the procedure(s).

## 2010-12-18 ENCOUNTER — Telehealth: Payer: Self-pay | Admitting: Internal Medicine

## 2010-12-18 NOTE — Telephone Encounter (Signed)
Wants Procedure Results call an give results around 3:30

## 2010-12-18 NOTE — Telephone Encounter (Signed)
Please let patient know, no evidence of colitis on biopsies. Biopsies were normal. We have not found a cause for her diarrhea as of this point in time. I recommend a follow up appointment with extender in the near future. Please let her know and copy the referring physician with the pathology report.

## 2010-12-18 NOTE — Telephone Encounter (Signed)
Results Cc to PCP  

## 2010-12-18 NOTE — Telephone Encounter (Signed)
Pt aware, please schedule appt and cc pcp

## 2010-12-19 NOTE — Telephone Encounter (Signed)
LMOM for return call to set up F/U OV

## 2011-01-16 ENCOUNTER — Encounter: Payer: Self-pay | Admitting: Gastroenterology

## 2011-01-16 ENCOUNTER — Ambulatory Visit (INDEPENDENT_AMBULATORY_CARE_PROVIDER_SITE_OTHER): Payer: 59 | Admitting: Gastroenterology

## 2011-01-16 VITALS — BP 153/82 | HR 71 | Temp 97.3°F | Ht 65.0 in | Wt 209.8 lb

## 2011-01-16 DIAGNOSIS — K5730 Diverticulosis of large intestine without perforation or abscess without bleeding: Secondary | ICD-10-CM

## 2011-01-16 DIAGNOSIS — K219 Gastro-esophageal reflux disease without esophagitis: Secondary | ICD-10-CM

## 2011-01-16 DIAGNOSIS — R109 Unspecified abdominal pain: Secondary | ICD-10-CM

## 2011-01-16 DIAGNOSIS — K589 Irritable bowel syndrome without diarrhea: Secondary | ICD-10-CM | POA: Insufficient documentation

## 2011-01-16 NOTE — Patient Instructions (Signed)
You may increase her omeprazole to 20 mg before breakfast and before evening meal if needed. If this does not controlling your nighttime symptoms please let me know.  Continue probiotics for management of irritable bowel syndrome.  Please call me if you have recurrent abdominal pain.

## 2011-01-16 NOTE — Progress Notes (Signed)
Primary Care Physician: Cassell Smiles., MD  Primary Gastroenterologist:  Roetta Sessions, MD   Chief Complaint  Patient presents with  . Follow-up    HPI: Diamond Mills is a 55 y.o. female here for followup of recent EGD and colonoscopy. See below for details. Doing very well now. States the probiotics are helping tremendously. Taking SAMS probiotic one daily. No more fecal urgency. Stools down to one daily, had been 7-8 daily. Stools solid. No melena, brbpr. Some heartburn if eat spicy foods at night. Wants to take occasional additional dose at nighttime of omeprazole. No longer on bentyl, make her reflux worse. RUQ pain sometimes worse with grease. If eat healthy, then very little pain. Weight is up 3 pounds.  Past Surgical History  Procedure Date  . Tubal ligation   . Cholecystectomy   . Shoulder surgery     Spur  . Cardiac catheterization     times 5  . Colonoscopy 12/11/10    diverticulosis, random bx negative for microscopic colitis  . Esophagogastroduodenoscopy 12/11/10    small hh, tiny antral erosions, SB bx negative for celiac     Current Outpatient Prescriptions  Medication Sig Dispense Refill  . acetaminophen (TYLENOL) 500 MG tablet Take 500 mg by mouth every 6 (six) hours as needed. For pain        . amLODipine (NORVASC) 5 MG tablet Take 5 mg by mouth daily.        Marland Kitchen aspirin 81 MG tablet Take 81 mg by mouth daily.        Marland Kitchen levothyroxine (SYNTHROID, LEVOTHROID) 75 MCG tablet Take 75 mcg by mouth daily.        Marland Kitchen lisinopril (PRINIVIL,ZESTRIL) 20 MG tablet Take 20 mg by mouth daily.        Marland Kitchen omeprazole (PRILOSEC) 20 MG capsule Take 20 mg by mouth daily.        . Probiotic Product (ALIGN) 4 MG CAPS Take 1 capsule by mouth daily.        Marland Kitchen dicyclomine (BENTYL) 10 MG capsule Take 10 mg by mouth 4 (four) times daily -  before meals and at bedtime.          Allergies as of 01/16/2011 - Review Complete 01/16/2011  Allergen Reaction Noted  . Contrast media (iodinated  diagnostic agents) Other (See Comments) 12/11/2010  . Penicillins Hives   . Sulfonamide derivatives Other (See Comments)   . Vancomycin Palpitations     ROS:  General: Negative for anorexia, weight loss, fever, chills, fatigue, weakness. ENT: Negative for hoarseness, difficulty swallowing , nasal congestion. CV: Negative for chest pain, angina, palpitations, dyspnea on exertion, peripheral edema.  Respiratory: Negative for dyspnea at rest, dyspnea on exertion, cough, sputum, wheezing.  GI: See history of present illness. GU:  Negative for dysuria, hematuria, urinary incontinence, urinary frequency, nocturnal urination.  Endo: Negative for unusual weight change.    Physical Examination:   BP 153/82  Pulse 71  Temp(Src) 97.3 F (36.3 C) (Temporal)  Ht 5\' 5"  (1.651 m)  Wt 209 lb 12.8 oz (95.165 kg)  BMI 34.91 kg/m2  LMP 12/10/2001  General: Well-nourished, well-developed in no acute distress.  Eyes: No icterus. Mouth: Oropharyngeal mucosa moist and pink , no lesions erythema or exudate. Lungs: Clear to auscultation bilaterally.  Heart: Regular rate and rhythm, no murmurs rubs or gallops.  Abdomen: Bowel sounds are normal, nontender, nondistended, no hepatosplenomegaly or masses, no abdominal bruits or hernia , no rebound or guarding.   Extremities: No lower  extremity edema. No clubbing or deformities. Neuro: Alert and oriented x 4   Skin: Warm and dry, no jaundice.   Psych: Alert and cooperative, normal mood and affect.

## 2011-01-16 NOTE — Assessment & Plan Note (Signed)
IBS with diarrhea and doing very well on probiotics. Fecal urgency and frequency completely resolved. Continue current regimen. She may use Bentyl when necessary for abdominal cramps and diarrhea.

## 2011-01-16 NOTE — Assessment & Plan Note (Signed)
Recommend high fiber diet

## 2011-01-16 NOTE — Progress Notes (Signed)
Cc to PCP 

## 2011-01-16 NOTE — Assessment & Plan Note (Addendum)
Currently asymptomatic. She will call if she has recurrent abdominal pain. Recent extensive evaluation unremarkable.

## 2011-01-16 NOTE — Assessment & Plan Note (Signed)
Occasional nocturnal reflux when she consumes spicy food. May take an additional omeprazole 20 mg half an hour before her evening meal as needed. Anti-reflex measures recommended and discussed with patient.

## 2011-10-23 ENCOUNTER — Encounter: Payer: Self-pay | Admitting: Cardiology

## 2011-10-23 ENCOUNTER — Ambulatory Visit (INDEPENDENT_AMBULATORY_CARE_PROVIDER_SITE_OTHER): Payer: 59 | Admitting: Cardiology

## 2011-10-23 VITALS — BP 168/98 | HR 70 | Ht 65.0 in | Wt 212.0 lb

## 2011-10-23 DIAGNOSIS — I7090 Unspecified atherosclerosis: Secondary | ICD-10-CM

## 2011-10-23 DIAGNOSIS — I10 Essential (primary) hypertension: Secondary | ICD-10-CM

## 2011-10-23 DIAGNOSIS — K219 Gastro-esophageal reflux disease without esophagitis: Secondary | ICD-10-CM | POA: Insufficient documentation

## 2011-10-23 DIAGNOSIS — I2510 Atherosclerotic heart disease of native coronary artery without angina pectoris: Secondary | ICD-10-CM | POA: Insufficient documentation

## 2011-10-23 DIAGNOSIS — R002 Palpitations: Secondary | ICD-10-CM

## 2011-10-23 DIAGNOSIS — E039 Hypothyroidism, unspecified: Secondary | ICD-10-CM

## 2011-10-23 NOTE — Patient Instructions (Addendum)
Your physician recommends that you schedule a follow-up appointment in: 1 month  Your physician has recommended that you wear an event monitor. Event monitors are medical devices that record the heart's electrical activity. Doctors most often [place] these monitors to diagnose arrhythmias. Arrhythmias are problems with the speed or rhythm of the heartbeat. The monitor is a small, portable device. You can wear one while you do your normal daily activities. This is usually used to diagnose what is causing palpitations/syncope (passing out).  Your physician recommends that you return for lab work in: Within the week

## 2011-10-23 NOTE — Assessment & Plan Note (Signed)
Replacement therapy is at low dose, and she is not even taking it reliably.  I doubt that she is hyperthyroid, but will assess thyroid function studies.

## 2011-10-23 NOTE — Assessment & Plan Note (Signed)
Blood pressure has been intermittently elevated.  She will likely require additional antihypertensive medication.

## 2011-10-23 NOTE — Progress Notes (Signed)
Patient ID: Diamond Mills, female   DOB: Oct 03, 1955, 56 y.o.   MRN: [id number]  HPI: Initial Cardiology evaluation for Diamond Mills kindly requested by Dr. Sherwood Gambler, for evaluation of palpitations.  Diamond Mills was evaluated originally in 2002 with chest discomfort.  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  In recent months, she has noted episodes of palpitations associated with lightheadedness and dyspnea.  Spells typically last for a matter of seconds, certainly no more than 1 minute.  They occur irregularly and relatively infrequently, but at least a few times per week.  There is no relationship to exertion nor associated chest discomfort.  She has a history of hypothyroidism, but forgets to take her medicines fairly frequently.  No recent laboratory studies have been performed.  Current Outpatient Prescriptions on File Prior to Visit  Medication Sig Dispense Refill  . amLODipine (NORVASC) 5 MG tablet Take 5 mg by mouth daily.        Marland Kitchen aspirin 81 MG tablet Take 81 mg by mouth daily.        Marland Kitchen levothyroxine (SYNTHROID, LEVOTHROID) 75 MCG tablet Take 75 mcg by mouth daily.        Marland Kitchen lisinopril (PRINIVIL,ZESTRIL) 20 MG tablet Take 20 mg by mouth daily.        Marland Kitchen omeprazole (PRILOSEC) 20 MG capsule Take 20 mg by mouth daily.        . Probiotic Product (ALIGN) 4 MG CAPS Take 1 capsule by mouth daily.         Allergies  Allergen Reactions  . Contrast Media (Iodinated Diagnostic Agents) Other (See Comments)    Low Blood pressure  . Penicillins Hives  . Sulfonamide Derivatives Other (See Comments)    High fever  . Vancomycin Palpitations    Past Medical History  Diagnosis Date  . Hypertension   . Gastroesophageal reflux disease     Peptic ulcer disease; Hiatal hernia, EGD normal in 2000; colonoscopy -internal hemorrhoids and 2000  . Irritable bowel syndrome   .  Hypothyroidism   . Cerebrovascular accident   . Arteriosclerotic cardiovascular disease (ASCVD)     Cardiac cath in 2002:50-60% D1, anomalous RCA from [facility] with 30% proximal stenosis; nonsustained VT in 2002  . Basal cell carcinoma 2000    Right face  . Migraine headache     Occasional  . Degenerative joint disease     Fingers  . Anxiety and depression   . Amaurosis fugax 2002    post cardiac catheterization-2002; normal carotid ultrasound    Past Surgical History  Procedure Date  . Tubal ligation     Bilateral  . Cholecystectomy   . Shoulder surgery     Spur  . Cardiac catheterization     x5; nonobstructive disease  . Colonoscopy 12/11/10    diverticulosis, random bx negative for microscopic colitis  . Esophagogastroduodenoscopy 12/11/10    small hh, tiny antral erosions, SB bx negative for celiac    Family History-unknown for the most part, as patient was adopted  Problem Relation Age of Onset  . Colon cancer Neg Hx   . Hypertension Sister     History   Social History  . Marital Status: Single    Spouse Name: N/A    Number of Children: 2  . Years of Education: N/A   Occupational History  . CNA    Social  History Main Topics  . Smoking status: Former Smoker -- 2.0 packs/day for 20 years    Types: Cigarettes  . Smokeless tobacco: Former Neurosurgeon    Quit date: 02/15/1989  . Alcohol Use: No  . Drug Use: No  . Sexually Active: Yes   Other Topics Concern  . Not on file   Social History Narrative  . No narrative on file    ROS:  Patient notes some decline in her level of energy.  She has dyspnea with moderate exertion, but is generally asymptomatic with a sedentary lifestyle.  She has had problems with gastroesophageal reflux disease symptoms and diarrhea.  Colonoscopy in 2012 revealed diverticula, but no other significant abnormalities.  She experiences rare migraine headaches, perhaps once or twice per year and has had some issues with anxiety.    All other systems  reviewed and are negative.  PHYSICAL EXAM: BP 168/98  Pulse 70  Ht 5\' 5"  (1.651 m)  Wt 96.163 kg (212 lb)  BMI 35.28 kg/m2  LMP 12/10/2001  General-Well-developed; no acute distress Body Habitus-Moderately overweight HEENT-[place]/AT; PERRL; EOM intact; conjunctiva and lids nl Neck-No JVD; no carotid bruits Endocrine-No thyromegaly Lungs-Clear lung fields; resonant percussion; normal I-to-E ratio Cardiovascular- normal PMI; normal S1 and S2; grade 1/6 basilar systolic ejection murmur Abdomen-BS normal; soft and non-tender without masses or organomegaly Musculoskeletal-No deformities, cyanosis or clubbing Neurologic-Nl cranial nerves; symmetric strength and tone Skin- Warm, no significant lesions Extremities-Nl distal pulses; Trace edema  EKG: Tracing performed 10/09/11 obtained and reviewed.  Normal sinus rhythm, left atrial abnormality, delayed R-wave progression, inferolateral ST segment depression-possible ischemia.  ASSESSMENT AND PLAN:  [place] Bing, MD 10/23/2011 3:55 PM

## 2011-10-23 NOTE — Progress Notes (Deleted)
Name: Diamond Mills    DOB: [birth year]  Age: 56 y.o.  MR#: [id number]       PCP:  Cassell Smiles., MD      Insurance: @PAYORNAME @   CC:   No chief complaint on file.   VS BP 168/98  Pulse 70  Ht 5\' 5"  (1.651 m)  Wt 212 lb (96.163 kg)  BMI 35.28 kg/m2  LMP 12/10/2001  Weights Current Weight  10/23/11 212 lb (96.163 kg)  01/16/11 209 lb 12.8 oz (95.165 kg)  12/11/10 206 lb (93.441 kg)    Blood Pressure  BP Readings from Last 3 Encounters:  10/23/11 168/98  01/16/11 153/82  12/11/10 123/82     Admit date:  (Not on file) Last encounter with RMR:  Visit date not found   Allergy Allergies  Allergen Reactions  . Contrast Media (Iodinated Diagnostic Agents) Other (See Comments)    Low Blood pressure  . Penicillins Hives  . Sulfonamide Derivatives Other (See Comments)    High fever  . Vancomycin Palpitations    Current Outpatient Prescriptions  Medication Sig Dispense Refill  . amLODipine (NORVASC) 5 MG tablet Take 5 mg by mouth daily.        Marland Kitchen aspirin 81 MG tablet Take 81 mg by mouth daily.        Marland Kitchen levothyroxine (SYNTHROID, LEVOTHROID) 75 MCG tablet Take 75 mcg by mouth daily.        Marland Kitchen lisinopril (PRINIVIL,ZESTRIL) 20 MG tablet Take 20 mg by mouth daily.        Marland Kitchen omeprazole (PRILOSEC) 20 MG capsule Take 20 mg by mouth daily.        . Probiotic Product (ALIGN) 4 MG CAPS Take 1 capsule by mouth daily.          Discontinued Meds:    Medications Discontinued During This Encounter  Medication Reason  . acetaminophen (TYLENOL) 500 MG tablet Patient has not taken in last 30 days  . dicyclomine (BENTYL) 10 MG capsule Patient has not taken in last 30 days    Patient Active Problem List  Diagnosis  . IBS (irritable bowel syndrome)  . Gastroesophageal reflux disease  . Hypertension  . Hypothyroidism  . Arteriosclerotic cardiovascular disease (ASCVD)    LABS No visits with results within 3 Month(s) from this visit. Latest known visit with results is:  Office Visit  on 11/16/2010  Component Date Value  . WBC 11/16/2010 7.1   . RBC 11/16/2010 4.29   . Hemoglobin 11/16/2010 13.1   . HCT 11/16/2010 39.6   . MCV 11/16/2010 92.3   . [facility] 11/16/2010 30.5   . MCHC 11/16/2010 33.1   . RDW 11/16/2010 14.2   . Platelets 11/16/2010 277   . Neutrophils Relative 11/16/2010 37*  . Neutro Abs 11/16/2010 2.6   . Lymphocytes Relative 11/16/2010 48*  . Lymphs Abs 11/16/2010 3.4   . Monocytes Relative 11/16/2010 12   . Monocytes Absolute 11/16/2010 0.8   . Eosinophils Relative 11/16/2010 3   . Eosinophils Absolute 11/16/2010 0.2   . Basophils Relative 11/16/2010 1   . Basophils Absolute 11/16/2010 0.0   . Smear Review 11/16/2010 Criteria for review not met   . Lipase 11/16/2010 25   . Total Bilirubin 11/16/2010 0.9   . Bilirubin, Direct 11/16/2010 0.2   . Indirect Bilirubin 11/16/2010 0.7   . Alkaline Phosphatase 11/16/2010 81   . AST 11/16/2010 20   . ALT 11/16/2010 16   . Total Protein 11/16/2010 6.9   .  Albumin 11/16/2010 4.3   . IgA 11/16/2010 164   . Tissue Transglutaminase * 11/16/2010 8.6   . Organism ID, Bacteria 11/16/2010 No Salmonella,Shigella,Campylobacter or Yersinia   . Organism ID, Bacteria 11/16/2010 isolated.   . C difficile by pcr 11/16/2010 Not Detected   . Giardia Screen (EIA) 11/16/2010 NEGATIVE   . LACTOFERRIN 11/16/2010 POSITIVE   . TSH 11/16/2010 2.948      Results for this Opt Visit:     Results for orders placed in visit on 11/16/10  CBC WITH DIFFERENTIAL      Component Value Range   WBC 7.1  4.0 - 10.5 K/uL   RBC 4.29  3.87 - 5.11 MIL/uL   Hemoglobin 13.1  12.0 - 15.0 g/dL   HCT 16.1  09.6 - 04.5 %   MCV 92.3  78.0 - 100.0 fL   MCH 30.5  26.0 - 34.0 pg   MCHC 33.1  30.0 - 36.0 g/dL   RDW 40.9  81.1 - 91.4 %   Platelets 277  150 - 400 K/uL   Neutrophils Relative 37 (*) 43 - 77 %   Neutro Abs 2.6  1.7 - 7.7 K/uL   Lymphocytes Relative 48 (*) 12 - 46 %   Lymphs Abs 3.4  0.7 - 4.0 K/uL   Monocytes Relative 12  3 - 12  %   Monocytes Absolute 0.8  0.1 - 1.0 K/uL   Eosinophils Relative 3  0 - 5 %   Eosinophils Absolute 0.2  0.0 - 0.7 K/uL   Basophils Relative 1  0 - 1 %   Basophils Absolute 0.0  0.0 - 0.1 K/uL   Smear Review Criteria for review not met    LIPASE      Component Value Range   Lipase 25  0 - 75 U/L  HEPATIC FUNCTION PANEL      Component Value Range   Total Bilirubin 0.9  0.3 - 1.2 mg/dL   Bilirubin, Direct 0.2  0.0 - 0.3 mg/dL   Indirect Bilirubin 0.7  0.0 - 0.9 mg/dL   Alkaline Phosphatase 81  39 - 117 U/L   AST 20  0 - 37 U/L   ALT 16  0 - 35 U/L   Total Protein 6.9  6.0 - 8.3 g/dL   Albumin 4.3  3.5 - 5.2 g/dL  IGA      Component Value Range   IgA 164  69 - 380 mg/dL  TISSUE TRANSGLUTAMINASE, IGA      Component Value Range   Tissue Transglutaminase Ab, IgA 8.6  <20 U/mL  STOOL CULTURE      Component Value Range   Organism ID, Bacteria No Salmonella,Shigella,Campylobacter or Yersinia     Organism ID, Bacteria isolated.    CLOSTRIDIUM DIFFICILE BY PCR      Component Value Range   C difficile by pcr Not Detected  Not Detected  GIARDIA ANTIGEN      Component Value Range   Giardia Screen (EIA) NEGATIVE    FECAL LACTOFERRIN      Component Value Range   LACTOFERRIN POSITIVE    TSH      Component Value Range   TSH 2.948  0.350 - 4.500 uIU/mL    EKG No orders found for this or any previous visit.   Prior Assessment and Plan Problem List as of 10/23/2011            Cardiology Problems   Hypertension   Arteriosclerotic cardiovascular disease (ASCVD)  Other   IBS (irritable bowel syndrome)   Last Assessment & Plan Note   01/16/2011 Office Visit Signed 01/16/2011  9:26 AM by Tiffany Kocher, PA    IBS with diarrhea and doing very well on probiotics. Fecal urgency and frequency completely resolved. Continue current regimen. She may use Bentyl when necessary for abdominal cramps and diarrhea.    Gastroesophageal reflux disease   Hypothyroidism       Imaging: No  results found.   FRS Calculation: Score not calculated. Missing: Total Cholesterol, HDL

## 2011-10-23 NOTE — Assessment & Plan Note (Signed)
No symptoms at present to suggest myocardial ischemia.  In light of minimal coronary disease identified in the past, a lipid profile will be assessed.

## 2011-10-25 LAB — CBC
HCT: 39.5 % (ref 36.0–46.0)
Hemoglobin: 13.2 g/dL (ref 12.0–15.0)
MCHC: 33.4 g/dL (ref 30.0–36.0)
RBC: 4.34 MIL/uL (ref 3.87–5.11)

## 2011-10-25 LAB — COMPREHENSIVE METABOLIC PANEL
AST: 20 U/L (ref 0–37)
Alkaline Phosphatase: 78 U/L (ref 39–117)
BUN: 15 mg/dL (ref 6–23)
Calcium: 9.7 mg/dL (ref 8.4–10.5)
Creat: 0.99 mg/dL (ref 0.50–1.10)

## 2011-10-25 LAB — LIPID PANEL
Cholesterol: 176 mg/dL (ref 0–200)
HDL: 50 mg/dL (ref >39)
Total CHOL/HDL Ratio: 3.5 Ratio
VLDL: 23 mg/dL (ref 0–40)

## 2011-10-26 ENCOUNTER — Encounter: Payer: Self-pay | Admitting: Cardiology

## 2011-10-26 ENCOUNTER — Other Ambulatory Visit: Payer: Self-pay | Admitting: *Deleted

## 2011-10-26 MED ORDER — PRAVASTATIN SODIUM 40 MG PO TABS: 40.0000 mg | ORAL_TABLET | Freq: Every evening | ORAL | Status: DC

## 2011-10-29 ENCOUNTER — Other Ambulatory Visit: Payer: Self-pay | Admitting: *Deleted

## 2011-10-31 ENCOUNTER — Telehealth: Payer: Self-pay | Admitting: Cardiology

## 2011-10-31 NOTE — Telephone Encounter (Signed)
States headache and gas and has not worked in 3 days due to this.  Advised her that this was unlikely, however I would get back to her with any recommendations.

## 2011-10-31 NOTE — Telephone Encounter (Signed)
Patient states that she is having reaction to Pravastatin. / tg

## 2011-10-31 NOTE — Telephone Encounter (Signed)
Patient is experiencing intolerance to Pravastatin.  Please advise.

## 2011-11-01 ENCOUNTER — Other Ambulatory Visit: Payer: Self-pay | Admitting: *Deleted

## 2011-11-01 ENCOUNTER — Other Ambulatory Visit: Payer: Self-pay | Admitting: Cardiology

## 2011-11-01 DIAGNOSIS — R002 Palpitations: Secondary | ICD-10-CM

## 2011-11-01 DIAGNOSIS — I10 Essential (primary) hypertension: Secondary | ICD-10-CM

## 2011-11-01 MED ORDER — LOVASTATIN 10 MG PO TABS: 10.0000 mg | ORAL_TABLET | Freq: Every day | ORAL | Status: DC

## 2011-11-01 NOTE — Telephone Encounter (Signed)
Recommendations given to patient.  Verbalized understanding. 

## 2011-11-01 NOTE — Telephone Encounter (Signed)
Change to lovastatin 10 mg per day.

## 2011-11-22 ENCOUNTER — Encounter: Payer: Self-pay | Admitting: Cardiology

## 2011-11-22 ENCOUNTER — Ambulatory Visit (INDEPENDENT_AMBULATORY_CARE_PROVIDER_SITE_OTHER): Payer: 59 | Admitting: Cardiology

## 2011-11-22 VITALS — BP 134/80 | HR 80 | Ht 65.0 in | Wt 214.4 lb

## 2011-11-22 DIAGNOSIS — I7090 Unspecified atherosclerosis: Secondary | ICD-10-CM

## 2011-11-22 DIAGNOSIS — I2510 Atherosclerotic heart disease of native coronary artery without angina pectoris: Secondary | ICD-10-CM

## 2011-11-22 DIAGNOSIS — I10 Essential (primary) hypertension: Secondary | ICD-10-CM

## 2011-11-22 DIAGNOSIS — E039 Hypothyroidism, unspecified: Secondary | ICD-10-CM

## 2011-11-22 NOTE — Progress Notes (Signed)
Patient ID: Diamond Mills, female   DOB: [birth year], 56 y.o.   MRN: [id number]  HPI: Scheduled return visit for this nice woman with the recent onset of palpitations.  Following application of her event recorder, she has experienced no subsequent symptoms.  She also describes minor chest pressure that resolved once she started treatment with lovastatin.  At present, she is asymptomatic.  Prior to Admission medications   Medication Sig Start Date End Date Taking? Authorizing Provider  amLODipine (NORVASC) 5 MG tablet Take 5 mg by mouth daily.     Yes Historical Provider, MD  aspirin 81 MG tablet Take 81 mg by mouth daily.     Yes Historical Provider, MD  levothyroxine (SYNTHROID, LEVOTHROID) 75 MCG tablet Take 75 mcg by mouth daily.     Yes Historical Provider, MD  lisinopril (PRINIVIL,ZESTRIL) 20 MG tablet Take 20 mg by mouth daily.     Yes Historical Provider, MD  lovastatin (MEVACOR) 10 MG tablet Take 1 tablet (10 mg total) by mouth at bedtime. 11/01/11  Yes Kathlen Brunswick, MD  omeprazole (PRILOSEC) 20 MG capsule Take 20 mg by mouth daily.     Yes Historical Provider, MD  Probiotic Product (ALIGN) 4 MG CAPS Take 1 capsule by mouth daily.     Yes Historical Provider, MD   Allergies  Allergen Reactions  . Contrast Media (Iodinated Diagnostic Agents) Other (See Comments)    Low Blood pressure  . Penicillins Hives  . Sulfonamide Derivatives Other (See Comments)    High fever  . Vancomycin Palpitations     Past medical history, social history, and family history reviewed and updated.  ROS: Denies lightheadedness or syncope.  PHYSICAL EXAM: BP 134/80  Pulse 80  Ht 5\' 5"  (1.651 m)  Wt 97.251 kg (214 lb 6.4 oz)  BMI 35.68 kg/m2  LMP 12/10/2001  General-Well developed; no acute distress Body habitus-proportionate weight and height Neck-No JVD; no carotid bruits Lungs-clear lung fields; resonant to percussion Cardiovascular-normal PMI; normal S1 and S2; modest systolic  murmur Abdomen-normal bowel sounds; soft and non-tender without masses or organomegaly Musculoskeletal-No deformities, no cyanosis or clubbing Neurologic-Normal cranial nerves; symmetric strength and tone Skin-Warm, no significant lesions Extremities-distal pulses intact; no edema   Event Recordings: Automatically recorded events, presumably in the absence of symptoms, Included a 10 beat run of ventricular tachycardia with a relatively long cycle length of approximately 550 ms, an episode of ventricular bigeminy of uncertain duration, but apparently lasting at least 1 minutes.  There appeared to have been additional recordings, but neither a summary nor other tracings are available.  ASSESSMENT AND PLAN:  [place] Bing, MD 11/22/2011 3:19 PM

## 2011-11-22 NOTE — Assessment & Plan Note (Signed)
Blood pressure control has improved.  Current medications will be continued.

## 2011-11-22 NOTE — Progress Notes (Deleted)
Name: Diamond Mills    DOB: 09-06-1955  Age: 56 y.o.  MR#: [id number]       PCP:  Cassell Smiles., MD      Insurance: @PAYORNAME @   CC:   No chief complaint on file.   VS BP 134/80  Pulse 80  Ht 5\' 5"  (1.651 m)  Wt 214 lb 6.4 oz (97.251 kg)  BMI 35.68 kg/m2  LMP 12/10/2001  Weights Current Weight  11/22/11 214 lb 6.4 oz (97.251 kg)  10/23/11 212 lb (96.163 kg)  01/16/11 209 lb 12.8 oz (95.165 kg)    Blood Pressure  BP Readings from Last 3 Encounters:  11/22/11 134/80  10/23/11 168/98  01/16/11 153/82     Admit date:  (Not on file) Last encounter with RMR:  11/01/2011   Allergy Allergies  Allergen Reactions  . Contrast Media (Iodinated Diagnostic Agents) Other (See Comments)    Low Blood pressure  . Penicillins Hives  . Sulfonamide Derivatives Other (See Comments)    High fever  . Vancomycin Palpitations    Current Outpatient Prescriptions  Medication Sig Dispense Refill  . amLODipine (NORVASC) 5 MG tablet Take 5 mg by mouth daily.        Marland Kitchen aspirin 81 MG tablet Take 81 mg by mouth daily.        Marland Kitchen levothyroxine (SYNTHROID, LEVOTHROID) 75 MCG tablet Take 75 mcg by mouth daily.        Marland Kitchen lisinopril (PRINIVIL,ZESTRIL) 20 MG tablet Take 20 mg by mouth daily.        Marland Kitchen lovastatin (MEVACOR) 10 MG tablet Take 1 tablet (10 mg total) by mouth at bedtime.  30 tablet  11  . omeprazole (PRILOSEC) 20 MG capsule Take 20 mg by mouth daily.        . Probiotic Product (ALIGN) 4 MG CAPS Take 1 capsule by mouth daily.          Discontinued Meds:   There are no discontinued medications.  Patient Active Problem List  Diagnosis  . IBS (irritable bowel syndrome)  . Gastroesophageal reflux disease  . Hypertension  . Hypothyroidism  . Arteriosclerotic cardiovascular disease (ASCVD)    LABS Office Visit on 10/23/2011  Component Date Value  . Sodium 10/25/2011 142   . Potassium 10/25/2011 4.7   . Chloride 10/25/2011 105   . CO2 10/25/2011 26   . Glucose, Bld 10/25/2011 80   .  BUN 10/25/2011 15   . Creat 10/25/2011 0.99   . Total Bilirubin 10/25/2011 1.4*  . Alkaline Phosphatase 10/25/2011 78   . AST 10/25/2011 20   . ALT 10/25/2011 15   . Total Protein 10/25/2011 6.9   . Albumin 10/25/2011 4.1   . Calcium 10/25/2011 9.7   . WBC 10/25/2011 8.8   . RBC 10/25/2011 4.34   . Hemoglobin 10/25/2011 13.2   . HCT 10/25/2011 39.5   . MCV 10/25/2011 91.0   . [facility] 10/25/2011 30.4   . MCHC 10/25/2011 33.4   . RDW 10/25/2011 13.7   . Platelets 10/25/2011 291   . TSH 10/25/2011 4.179   . Cholesterol 10/25/2011 176   . Triglycerides 10/25/2011 114   . HDL 10/25/2011 50   . Total CHOL/HDL Ratio 10/25/2011 3.5   . VLDL 10/25/2011 23   . LDL Cholesterol 10/25/2011 103*     Results for this Opt Visit:     Results for orders placed in visit on 10/23/11  COMPREHENSIVE METABOLIC PANEL      Component Value Range  Sodium 142  135 - 145 mEq/L   Potassium 4.7  3.5 - 5.3 mEq/L   Chloride 105  96 - 112 mEq/L   CO2 26  19 - 32 mEq/L   Glucose, Bld 80  70 - 99 mg/dL   BUN 15  6 - 23 mg/dL   Creat 4.09  8.11 - 9.14 mg/dL   Total Bilirubin 1.4 (*) 0.3 - 1.2 mg/dL   Alkaline Phosphatase 78  39 - 117 U/L   AST 20  0 - 37 U/L   ALT 15  0 - 35 U/L   Total Protein 6.9  6.0 - 8.3 g/dL   Albumin 4.1  3.5 - 5.2 g/dL   Calcium 9.7  8.4 - 78.2 mg/dL  CBC      Component Value Range   WBC 8.8  4.0 - 10.5 K/uL   RBC 4.34  3.87 - 5.11 MIL/uL   Hemoglobin 13.2  12.0 - 15.0 g/dL   HCT 95.6  21.3 - 08.6 %   MCV 91.0  78.0 - 100.0 fL   MCH 30.4  26.0 - 34.0 pg   MCHC 33.4  30.0 - 36.0 g/dL   RDW 57.8  46.9 - 62.9 %   Platelets 291  150 - 400 K/uL  TSH      Component Value Range   TSH 4.179  0.350 - 4.500 uIU/mL  LIPID PANEL      Component Value Range   Cholesterol 176  0 - 200 mg/dL   Triglycerides 528  <413 mg/dL   HDL 50  >24 mg/dL   Total CHOL/HDL Ratio 3.5     VLDL 23  0 - 40 mg/dL   LDL Cholesterol 401 (*) 0 - 99 mg/dL    EKG Orders placed in visit on 11/01/11    . CARDIAC EVENT MONITOR     Prior Assessment and Plan Problem List as of 11/22/2011            Cardiology Problems   Hypertension   Last Assessment & Plan Note   10/23/2011 Office Visit Signed 10/23/2011  4:11 PM by Kathlen Brunswick, MD    Blood pressure has been intermittently elevated.  She will likely require additional antihypertensive medication.    Arteriosclerotic cardiovascular disease (ASCVD)   Last Assessment & Plan Note   10/23/2011 Office Visit Signed 10/23/2011  4:10 PM by Kathlen Brunswick, MD    No symptoms at present to suggest myocardial ischemia.  In light of minimal coronary disease identified in the past, a lipid profile will be assessed.      Other   IBS (irritable bowel syndrome)   Last Assessment & Plan Note   01/16/2011 Office Visit Signed 01/16/2011  9:26 AM by Tiffany Kocher, PA    IBS with diarrhea and doing very well on probiotics. Fecal urgency and frequency completely resolved. Continue current regimen. She may use Bentyl when necessary for abdominal cramps and diarrhea.    Gastroesophageal reflux disease   Hypothyroidism   Last Assessment & Plan Note   10/23/2011 Office Visit Signed 10/23/2011  4:12 PM by Kathlen Brunswick, MD    Replacement therapy is at low dose, and she is not even taking it reliably.  I doubt that she is hyperthyroid, but will assess thyroid function studies.        Imaging: No results found.   FRS Calculation: Score not calculated. Missing: Total Cholesterol

## 2011-11-22 NOTE — Assessment & Plan Note (Signed)
Patient appears to be euthyroid clinically and by virtue of a normal TSH.

## 2011-11-22 NOTE — Patient Instructions (Addendum)
Your physician recommends that you schedule a follow-up appointment in: 6 months  Call us if you have any further palpitatoins

## 2011-11-22 NOTE — Assessment & Plan Note (Addendum)
No symptoms at present to suggest progression of disease; stress testing will be deferred.  Recent lipid profile off drug was fairly good, but in light of history of coronary artery disease, pharmacologic therapy is reasonable.  Since baseline LDL was 103, we can assume that current therapy will bring her to goal.

## 2012-10-02 ENCOUNTER — Other Ambulatory Visit: Payer: Self-pay | Admitting: Cardiology

## 2012-10-02 NOTE — Telephone Encounter (Signed)
Medication sent via escribe.  

## 2012-10-29 ENCOUNTER — Ambulatory Visit (INDEPENDENT_AMBULATORY_CARE_PROVIDER_SITE_OTHER): Payer: 59 | Admitting: Obstetrics and Gynecology

## 2012-10-29 ENCOUNTER — Encounter: Payer: Self-pay | Admitting: Obstetrics and Gynecology

## 2012-10-29 VITALS — BP 120/80 | Ht 65.0 in | Wt 217.8 lb

## 2012-10-29 DIAGNOSIS — N812 Incomplete uterovaginal prolapse: Secondary | ICD-10-CM

## 2012-10-29 MED ORDER — SOLIFENACIN SUCCINATE 10 MG PO TABS: 5.0000 mg | ORAL_TABLET | Freq: Every day | ORAL | Status: DC

## 2012-10-29 NOTE — Patient Instructions (Signed)
Return for ultrasound and then for pap, physical shortly afterwards Then decide on surgery

## 2012-10-29 NOTE — Progress Notes (Addendum)
Patient ID: Diamond Mills, female   DOB: 1956/01/16, 57 y.o.   MRN: [id number] Pt here today for a possible prolapsed uterus. Pt wants to discuss options. Maybe a Hysterectomy  NOted x 2 yrs.  C/o pelvic ache, down rt leg especially.   [facility] Visit  Patient name: Diamond Mills MRN [id number]  Date of birth: 1956-02-09  CC & HPI:  Diamond Mills is a 57 y.o. female presenting today for as above. +SUI with laugh, cry, also urge first thing in a.m. Has wet pants. Wears pad if out of house. Leaks "not a lot" with laugh or cough. BM's : not a problem, occurs daily. No splinting to defecate.  ROS:  1 care: [place]/.  Pertinent History Reviewed:  Medical & Surgical Hx:  Reviewed: Significant for htn, thyroid, cholesterol , GERD. Medications: Reviewed & Updated - see associated section Social History: Reviewed -  reports that she quit smoking about 31 years ago. Her smoking use included Cigarettes. She has a 30 pack-year smoking history. She has never used smokeless tobacco.  Objective Findings:  Vitals: BP 120/80  Ht 5\' 5"  (1.651 m)  Wt 217 lb 12.8 oz (98.793 kg)  BMI 36.24 kg/m2  LMP 12/10/2001  Physical Examination: General appearance - alert, well appearing, and in no distress Mental status - alert, oriented to person, place, and time Abdomen - soft, nontender, nondistended, no masses or organomegaly Pelvic - VULVA: normal appearing vulva with no masses, tenderness or lesions, VAGINA: atrophic, PELVIC FLOOR EXAM: cystocele to introitus, uterine descensus grade 2, CERVIX: normal appearing cervix without discharge or lesions, no discharge noted, cervical stenosis present, descensus, UTERUS: uterus is normal size, shape, consistency and nontender, retroverted, mobile, , ADNEXA: normal adnexa in size, nontender and no masses   Assessment & Plan:   Cystocele, to introitus. uterune descensus Sui Urge incontinence.  Trial Vesicare 10 mg daily for urge sx. Return for full  physical and pap Return for transvaginal u/s , would need endo bx prior to hyst unless lining less than 5 Mm.  Sen by Orlan Leavens these are my notes

## 2012-11-14 ENCOUNTER — Encounter: Payer: Self-pay | Admitting: Obstetrics and Gynecology

## 2012-11-14 ENCOUNTER — Ambulatory Visit (INDEPENDENT_AMBULATORY_CARE_PROVIDER_SITE_OTHER): Payer: 59 | Admitting: Obstetrics and Gynecology

## 2012-11-14 ENCOUNTER — Ambulatory Visit (INDEPENDENT_AMBULATORY_CARE_PROVIDER_SITE_OTHER): Payer: 59

## 2012-11-14 ENCOUNTER — Other Ambulatory Visit (HOSPITAL_COMMUNITY)
Admission: RE | Admit: 2012-11-14 | Discharge: 2012-11-14 | Disposition: A | Discharge status: Home / Self Care | Payer: 59 | Source: Ambulatory Visit | Attending: Obstetrics and Gynecology | Admitting: Obstetrics and Gynecology

## 2012-11-14 VITALS — BP 120/70 | Ht 65.0 in | Wt 215.0 lb

## 2012-11-14 DIAGNOSIS — N812 Incomplete uterovaginal prolapse: Secondary | ICD-10-CM

## 2012-11-14 DIAGNOSIS — Z01419 Encounter for gynecological examination (general) (routine) without abnormal findings: Secondary | ICD-10-CM | POA: Insufficient documentation

## 2012-11-14 DIAGNOSIS — Z1151 Encounter for screening for human papillomavirus (HPV): Secondary | ICD-10-CM | POA: Insufficient documentation

## 2012-11-14 DIAGNOSIS — Z113 Encounter for screening for infections with a predominantly sexual mode of transmission: Secondary | ICD-10-CM | POA: Insufficient documentation

## 2012-11-14 NOTE — Progress Notes (Signed)
Patient ID: Diamond Mills, female   DOB: 01-Dec-1955, 57 y.o.   MRN: [id number]   [facility] Visit  Patient name: Diamond Mills MRN [id number]  Date of birth: 05-14-1955  CC & HPI:  Diamond Mills is a 57 y.o. female presenting today for pap, pex. Here with husband (morbidly obese) with exam reviewed with both in office after exam.  Exam documentation delayed after system went down after exam.  ROS:   +SUI with laugh, cry, also urge first thing in a.m. Has wet pants. Wears pad if out of house. Leaks "not a lot" with laugh or cough.  BM's : not a problem, occurs daily. No splinting to defecate.  Pertinent History Reviewed:  Medical & Surgical Hx:  Reviewed: Significant for htn, thyroid, cholesterol , GERD.  Medications: Reviewed & Updated - see associated section Social History: Reviewed -  reports that she quit smoking about 31 years ago. Her smoking use included Cigarettes. She has a 30 pack-year smoking history. She has never used smokeless tobacco.  Objective Findings:  Vitals: BP 120/70  Ht 5\' 5"  (1.651 m)  Wt 215 lb (97.523 kg)  BMI 35.78 kg/m2  LMP 12/10/2001  Physical Examination:General appearance - alert, well appearing, and in no distress  Mental status - alert, oriented to person, place, and time  Abdomen - soft, nontender, nondistended, no masses or organomegaly  Pelvic - VULVA: normal appearing vulva with no masses, tenderness or lesions, VAGINA: atrophic, PELVIC FLOOR EXAM: cystocele to introitus, uterine descensus grade 2, CERVIX: normal appearing cervix without discharge or lesions, no discharge noted, cervical stenosis present, descensus, UTERUS: uterus is normal size, shape, consistency and nontender, retroverted, mobile, , ADNEXA: normal adnexa in size, nontender and no masses     Assessment & Plan:    Discussed need to consider urodynamics if pt wants to consider surgery for leakage. Pt to decide after further review in office with husb as part of  discussion  , couldn't decide at this visit.

## 2012-11-14 NOTE — Patient Instructions (Addendum)
Decide if you want bladder testing (urodynamics) before hysterectomy. Pap results will be done by next week .Urodynamic Study A urodynamic study is a set of tests and X-rays. These tests help to find out why you are having problems holding on to your pee (urine) or with peeing (urinating). It helps the doctor see your:  Bladder.  Urethra.  Valves in your body that control your pee (sphincters). TEST  Two thin tubes (catheters) are used. One is put in your bladder and the other is put into where your poop comes out (rectum).  The tube that is put into your bladder will be filled with a cup of germ free (sterile) water. The tubes help check how your bladder is doing as it is being filled up.  Once your bladder is filled, X-rays are taken while you cough or push down as if you were trying to poop (have a bowel movement).  This test will show why you are having a problem with leaking pee.  In the last part of the test, you will need to pee while the tube is still in your bladder.  The whole test will take about 30 to 45 minutes. When it is done, your doctor will talk to you about what kinds of treatments may work best for you. Document Released: 01/12/2008 Document Revised: 04/23/2011 Document Reviewed: 01/12/2008 [facility] Patient Information 2014 [place], [place].

## 2012-11-24 ENCOUNTER — Other Ambulatory Visit: Payer: Self-pay | Admitting: Cardiology

## 2012-11-24 ENCOUNTER — Telehealth: Payer: Self-pay | Admitting: Obstetrics and Gynecology

## 2012-11-24 NOTE — Telephone Encounter (Signed)
Pt aware of results and asked about urodynamics. Pt advised that I would make sure that Dr. Emelda Fear took care of it and she would know something by the end of the week.

## 2012-12-15 ENCOUNTER — Ambulatory Visit (INDEPENDENT_AMBULATORY_CARE_PROVIDER_SITE_OTHER): Payer: 59 | Admitting: Obstetrics and Gynecology

## 2012-12-15 ENCOUNTER — Encounter: Payer: Self-pay | Admitting: Obstetrics and Gynecology

## 2012-12-15 VITALS — BP 120/74 | Ht 65.0 in | Wt 219.0 lb

## 2012-12-15 DIAGNOSIS — IMO0002 Reserved for concepts with insufficient information to code with codable children: Secondary | ICD-10-CM

## 2012-12-15 DIAGNOSIS — N8111 Cystocele, midline: Secondary | ICD-10-CM

## 2012-12-15 LAB — POCT URINALYSIS DIPSTICK
Blood, UA: NEGATIVE
Protein, UA: NEGATIVE

## 2012-12-15 NOTE — Patient Instructions (Signed)
Urodynamics with Dr. Turner Daniels Wednesday 19 November 10 AM

## 2012-12-15 NOTE — Progress Notes (Signed)
Patient ID: Diamond Mills, female   DOB: Jun 16, 1955, 57 y.o.   MRN: [id number] Pt here today to discuss next step. Pt states that she is ready to have surgery she just needs to find out when and what she is going to have done. Pt wants to discuss surgery with Dr. Emelda Fear and discuss a bladder sling etc.    [facility] Visit  Patient name: Diamond Mills MRN [id number]  Date of birth: August 02, 1955  CC & HPI:  Diamond Mills is a 57 y.o. female presenting today for followup re: surgery options. Pt c/o pressure and pelvic pain esp with increased activity, such as "walking around" to shop, Feels like everything is going to fall out.  ROS:  U/s done of pelvis: Diamond Mills is a 57 y.o. for a pelvic sonogram for uterine prolapse.  Uterus 7.5 x 4.7 x 3.3 cm, anteverted  Endometrium 4.3 mm, symmetrical,  Right ovary 2.5 x 1.7 x 1.0 cm,  Left ovary 2.3 x 1.8 x 1.2 cm,  No free fluid noted  Technician Comments:  No free fluid or adnexal masses noted  [name]  11/14/2012  11:15 AM  Clinical Impression and recommendations:  I have reviewed the sonogram results above.  Combined with the patient's current clinical course, below are my impressions and any appropriate recommendations for management based on the sonographic findings: Thin endometrium, no adnexal pathology, proceed with urodynamic workup, heading toward  [facility] A&P, with decisions re: sling depending urodynamics testing. [name]    Pertinent History Reviewed:  Medical & Surgical Hx:  Reviewed: Significant for HTN, DM, Arteriosclerosis(ASCVD-[place]), IBS, hypercholesteroemia, on lovastatin, Hypothyroidism, controlled, GERD Medications: Reviewed & Updated - see associated section , levothyroxin75 mcg, probiotic, lovastatin, omeprazole 20(OTC), Vit D , calcium Social History: Reviewed -  reports that she quit smoking about 31 years ago. Her smoking use included Cigarettes. She has a 30 pack-year smoking history. She  has never used smokeless tobacco.  Objective Findings:  Vitals: BP 120/74  Ht 5\' 5"  (1.651 m)  Wt 219 lb (99.338 kg)  BMI 36.44 kg/m2  LMP 12/10/2001  Physical Examination:  Exam deferred  Assessment & Plan:   Generalized pelvic relaxation with ut descensus, cystocele, and Mixed UI Discussed with Dr Despina Hidden , for urodynamics 19 nov, Wednesday , 10 am

## 2012-12-18 ENCOUNTER — Other Ambulatory Visit: Payer: Self-pay

## 2012-12-22 ENCOUNTER — Ambulatory Visit: Payer: 59 | Admitting: Cardiology

## 2012-12-31 ENCOUNTER — Ambulatory Visit (INDEPENDENT_AMBULATORY_CARE_PROVIDER_SITE_OTHER): Payer: 59 | Admitting: Obstetrics & Gynecology

## 2012-12-31 ENCOUNTER — Telehealth: Payer: Self-pay | Admitting: Obstetrics & Gynecology

## 2012-12-31 ENCOUNTER — Encounter: Payer: Self-pay | Admitting: Obstetrics & Gynecology

## 2012-12-31 VITALS — BP 130/80 | Ht 65.0 in | Wt 218.0 lb

## 2012-12-31 DIAGNOSIS — N393 Stress incontinence (female) (male): Secondary | ICD-10-CM

## 2012-12-31 DIAGNOSIS — N3642 Intrinsic sphincter deficiency (ISD): Secondary | ICD-10-CM

## 2012-12-31 NOTE — Telephone Encounter (Signed)
I am in the process of making the arrangements, left a message with the office of Dr Lavella Hammock in [place], operates at [facility], who is a Warehouse manager.  I informed Dr Emelda Fear of the findings and he agrees with the management referral.

## 2012-12-31 NOTE — Telephone Encounter (Signed)
Pt informed Dr. Despina Hidden and Dr. Emelda Fear in agreement to refer pt to Dr. Boyd Kerbs in [place], Urogynecologist. Dr. Despina Hidden in process of arranging referral. Pt verbalized understanding.

## 2013-01-05 ENCOUNTER — Telehealth: Payer: Self-pay | Admitting: Obstetrics & Gynecology

## 2013-01-05 NOTE — Telephone Encounter (Signed)
Pt informed per Dr. Despina Hidden, Dr. Molli Hazard office to call pt with appt. Gave pt Dr. Ashley Royalty office number [phone] to follow up with there office if needed.

## 2013-01-12 ENCOUNTER — Telehealth: Payer: Self-pay | Admitting: Obstetrics & Gynecology

## 2013-02-13 ENCOUNTER — Encounter: Payer: Self-pay | Admitting: *Deleted

## 2013-02-16 ENCOUNTER — Encounter: Payer: Self-pay | Admitting: Obstetrics & Gynecology

## 2013-02-16 NOTE — Progress Notes (Signed)
Patient ID: Diamond Mills, female   DOB: 01-30-1956, 58 y.o.   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

## 2013-03-19 DIAGNOSIS — N6019 Diffuse cystic mastopathy of unspecified breast: Secondary | ICD-10-CM

## 2013-03-19 DIAGNOSIS — F419 Anxiety disorder, unspecified: Secondary | ICD-10-CM

## 2013-03-19 DIAGNOSIS — G43909 Migraine, unspecified, not intractable, without status migrainosus: Secondary | ICD-10-CM

## 2013-03-19 DIAGNOSIS — M1990 Unspecified osteoarthritis, unspecified site: Secondary | ICD-10-CM

## 2013-03-19 DIAGNOSIS — R002 Palpitations: Secondary | ICD-10-CM

## 2013-03-20 ENCOUNTER — Encounter: Payer: Self-pay | Admitting: Cardiology

## 2013-03-20 ENCOUNTER — Ambulatory Visit (INDEPENDENT_AMBULATORY_CARE_PROVIDER_SITE_OTHER): Payer: 59 | Admitting: Cardiology

## 2013-03-20 VITALS — BP 122/79 | HR 77 | Ht 65.0 in | Wt 219.0 lb

## 2013-03-20 DIAGNOSIS — Z01810 Encounter for preprocedural cardiovascular examination: Secondary | ICD-10-CM

## 2013-03-20 DIAGNOSIS — I2510 Atherosclerotic heart disease of native coronary artery without angina pectoris: Secondary | ICD-10-CM

## 2013-03-20 DIAGNOSIS — I10 Essential (primary) hypertension: Secondary | ICD-10-CM

## 2013-03-20 MED ORDER — LOVASTATIN 10 MG PO TABS: 10.0000 mg | ORAL_TABLET | Freq: Every day | ORAL | Status: DC

## 2013-03-20 NOTE — Progress Notes (Signed)
Clinical Summary Diamond Mills is a 58 y.o.female referred for preoperative cardiology consultation prior to planned hysterectomy on February 12 by Dr. Zigmund Daniel at [facility]. She is a former patient of Dr. Lattie Haw, last seen in October 2013 - this is our first meeting in the office. History is reviewed below including nonobstructive CAD documented in 2002, as well as recurring palpitations. She has a previously documented history of frequent PVCs and some NSVT by prior monitoring. LVEF was 55% as of cardiac catheterization in 2002. She tells that over the last year, she has had fewer palpitations, no dizziness or syncope.  No recent ECG available for review - states that she had one done at [facility], however we  were not able to get a copy for today's visit. We did a repeat tracing in the office today at no charge to her. ECG shows sinus rhythm with PVC, inferolateral ST segment depression and nonspecific T-wave changes. Found in old tracing from August 2013 that showed similar ST segment abnormalities.  She describes typical activities that meet or exceed 4 METS without exertional chest pain, stable NYHA class II dyspnea. She works as a Quarry manager. She reports compliance with her medications. Last assessment of LVEF was in 2002.   Allergies  Allergen Reactions  . Contrast Media [Iodinated Diagnostic Agents] Other (See Comments)    Low Blood pressure  . Erythromycin Hives  . Penicillins Hives  . Sulfonamide Derivatives Other (See Comments)    High fever  . Vancomycin Palpitations    Current Outpatient Prescriptions  Medication Sig Dispense Refill  . amLODipine (NORVASC) 5 MG tablet Take 5 mg by mouth daily.        Marland Kitchen aspirin 81 MG tablet Take 81 mg by mouth daily.        . calcium carbonate (OS-CAL) 600 MG TABS tablet Take 600 mg by mouth 2 (two) times daily with a meal.      . cholecalciferol (VITAMIN D) 1000 UNITS tablet Take 2,000 Units by mouth daily.      Marland Kitchen ESTRACE VAGINAL 0.1 MG/GM  vaginal cream       . ibuprofen (ADVIL,MOTRIN) 600 MG tablet       . levothyroxine (SYNTHROID, LEVOTHROID) 75 MCG tablet Take 75 mcg by mouth daily.        Marland Kitchen lisinopril (PRINIVIL,ZESTRIL) 20 MG tablet Take 20 mg by mouth daily.        Marland Kitchen omeprazole (PRILOSEC) 20 MG capsule Take 20 mg by mouth daily.        . Probiotic Product (ALIGN) 4 MG CAPS Take 1 capsule by mouth daily.        . solifenacin (VESICARE) 10 MG tablet Take 0.5 tablets (5 mg total) by mouth daily. At bedtime  30 tablet  3  . lovastatin (MEVACOR) 10 MG tablet Take 1 tablet (10 mg total) by mouth at bedtime.  90 tablet  2   No current facility-administered medications for this visit.    Past Medical History  Diagnosis Date  . Essential hypertension, benign   . Gastroesophageal reflux disease     Peptic ulcer disease; Hiatal hernia, EGD normal in 2000; colonoscopy - internal hemorrhoids and 2000  . Irritable bowel syndrome   . Hypothyroidism   . Cerebrovascular accident   . Coronary atherosclerosis of native coronary artery     Nonobstructive in 2002 (50-60% D1, anomalous RCA from [facility] with 30% proximal stenosis)  . Basal cell carcinoma 2000    Right face  .  Migraine headache     Occasional  . Degenerative joint disease     Fingers  . Anxiety and depression   . Amaurosis fugax     Post cardiac catheterization 2002; normal carotid ultrasound  . Palpitations   . Diverticulosis     On screening colonoscopy    Past Surgical History  Procedure Laterality Date  . Tubal ligation      Bilateral  . Cholecystectomy    . Shoulder surgery      Spur  . Cardiac catheterization  2002    Multiple procedures prior to 2002; nonobstructive disease  . Colonoscopy  12/11/10    Diverticulosis, random bx negative for microscopic colitis  . Esophagogastroduodenoscopy  12/11/10    Small hh, tiny antral erosions, SB bx negative for celiac  . Tubal ligation    . Cholecystectomy, laparoscopic      Family History  Problem Relation  Age of Onset  . Adopted: Yes  . Colon cancer Neg Hx   . Hypertension Sister     Social History Diamond Mills reports that she quit smoking about 31 years ago. Her smoking use included Cigarettes. She has a 30 pack-year smoking history. She has never used smokeless tobacco. Diamond Mills reports that she does not drink alcohol.  Review of Systems No orthopnea or PND, no leg edema, stable appetite, no bleeding problems. Does have trouble with IBS. Otherwise negative  Physical Examination Filed Vitals:   03/20/13 1535  BP: 122/79  Pulse: 77   Filed Weights   03/20/13 1535  Weight: 219 lb (99.338 kg)   Patient appears comfortable at rest. HEENT: Conjunctiva and lids normal, oropharynx clear. Neck: Supple, no elevated JVP or carotid bruits, no thyromegaly. Lungs: Clear to auscultation, nonlabored breathing at rest. Cardiac: Regular rate and rhythm with occasional ectopic beats, no S3, soft systolic murmur, no pericardial rub. Abdomen: Soft, nontender, bowel sounds present, no guarding or rebound. Extremities: Trace edema, distal pulses 2+. Skin: Warm and dry. Musculoskeletal: No kyphosis. Neuropsychiatric: Alert and oriented x3, affect grossly appropriate.   Problem List and Plan   Preoperative cardiovascular examination Patient scheduled for hysterectomy with Dr. Zigmund Daniel at [facility] on February 12. She has a history of nonobstructive CAD documented 2002, reports no exertional angina symptoms. With activities described meeting or exceeding 4 METs, she reports no progressive shortness of breath, has had no CHF symptoms. Her ECG is abnormal as outlined above, however consistent with a prior tracing from 2013. She does have a documented history of PVCs and even some NSVT in the past, however this has been the setting of normal LVEF, no syncope. Plan is to obtain an echocardiogram, and if LVEF remains normal, no further cardiac workup will be required prior to her proceeding with surgery.  She is not on a beta blocker at this time, and given the close proximity to planned surgery would not start one now. This can ultimately be considered for suppression of PVCs if this remains asymptomatic tissue. We will schedule followup visit.  Coronary atherosclerosis of native coronary artery Nonobstructive at cardiac catheterization 2002 as outlined above. Plan to continue medical therapy at this point. She has been on aspirin, Norvasc, ACE inhibitor, and Mevacor. Aspirin could be held prior to surgery if requested.  Essential hypertension, benign Blood pressure is normal today. No change to current regimen.    Satira Sark, M.D., F.A.C.C.

## 2013-03-20 NOTE — Assessment & Plan Note (Signed)
Nonobstructive at cardiac catheterization 2002 as outlined above. Plan to continue medical therapy at this point. She has been on aspirin, Norvasc, ACE inhibitor, and Mevacor. Aspirin could be held prior to surgery if requested.

## 2013-03-20 NOTE — Assessment & Plan Note (Signed)
Patient scheduled for hysterectomy with Dr. Zigmund Daniel at [facility] on February 12. She has a history of nonobstructive CAD documented 2002, reports no exertional angina symptoms. With activities described meeting or exceeding 4 METs, she reports no progressive shortness of breath, has had no CHF symptoms. Her ECG is abnormal as outlined above, however consistent with a prior tracing from 2013. She does have a documented history of PVCs and even some NSVT in the past, however this has been the setting of normal LVEF, no syncope. Plan is to obtain an echocardiogram, and if LVEF remains normal, no further cardiac workup will be required prior to her proceeding with surgery. She is not on a beta blocker at this time, and given the close proximity to planned surgery would not start one now. This can ultimately be considered for suppression of PVCs if this remains asymptomatic tissue. We will schedule followup visit.

## 2013-03-20 NOTE — Patient Instructions (Signed)
Your physician recommends that you schedule a follow-up appointment in: 6 months with Dr Ferne Reus will receive a reminder letter two months in advance reminding you to call and schedule your appointment. If you don't receive this letter, please contact our office.   Your physician has requested that you have an echocardiogram. Echocardiography is a painless test that uses sound waves to create images of your heart. It provides your doctor with information about the size and shape of your heart and how well your heart's chambers and valves are working. This procedure takes approximately one hour. There are no restrictions for this procedure.

## 2013-03-20 NOTE — Assessment & Plan Note (Signed)
Blood pressure is normal today. No change to current regimen. 

## 2013-03-23 ENCOUNTER — Ambulatory Visit (HOSPITAL_COMMUNITY)
Admission: RE | Admit: 2013-03-23 | Discharge: 2013-03-23 | Disposition: A | Discharge status: Home / Self Care | Payer: 59 | Source: Ambulatory Visit | Attending: Cardiology | Admitting: Cardiology

## 2013-03-23 DIAGNOSIS — I2510 Atherosclerotic heart disease of native coronary artery without angina pectoris: Secondary | ICD-10-CM | POA: Insufficient documentation

## 2013-03-23 DIAGNOSIS — I10 Essential (primary) hypertension: Secondary | ICD-10-CM | POA: Insufficient documentation

## 2013-03-23 DIAGNOSIS — I517 Cardiomegaly: Secondary | ICD-10-CM

## 2013-03-23 DIAGNOSIS — F17200 Nicotine dependence, unspecified, uncomplicated: Secondary | ICD-10-CM | POA: Insufficient documentation

## 2013-03-23 DIAGNOSIS — Z8673 Personal history of transient ischemic attack (TIA), and cerebral infarction without residual deficits: Secondary | ICD-10-CM | POA: Insufficient documentation

## 2013-03-23 DIAGNOSIS — R002 Palpitations: Secondary | ICD-10-CM | POA: Insufficient documentation

## 2013-03-23 NOTE — Progress Notes (Signed)
*  PRELIMINARY RESULTS* Echocardiogram 2D Echocardiogram has been performed.  [place], [place] 03/23/2013, 12:14 PM

## 2013-03-24 NOTE — Addendum Note (Signed)
Addended by: Shara Blazing A on: 03/24/2013 09:52 AM   Modules accepted: Orders

## 2013-12-15 ENCOUNTER — Other Ambulatory Visit: Payer: Self-pay | Admitting: Cardiology

## 2014-01-19 ENCOUNTER — Ambulatory Visit (HOSPITAL_COMMUNITY)
Admission: RE | Admit: 2014-01-19 | Discharge: 2014-01-19 | Disposition: A | Discharge status: Home / Self Care | Payer: 59 | Source: Ambulatory Visit | Attending: Family Medicine | Admitting: Family Medicine

## 2014-01-19 ENCOUNTER — Other Ambulatory Visit (HOSPITAL_COMMUNITY): Payer: Self-pay | Admitting: Family Medicine

## 2014-01-19 DIAGNOSIS — M85871 Other specified disorders of bone density and structure, right ankle and foot: Secondary | ICD-10-CM | POA: Diagnosis not present

## 2014-01-19 DIAGNOSIS — W19XXXA Unspecified fall, initial encounter: Secondary | ICD-10-CM | POA: Insufficient documentation

## 2014-01-19 DIAGNOSIS — S93501A Unspecified sprain of right great toe, initial encounter: Secondary | ICD-10-CM | POA: Diagnosis present

## 2014-02-12 HISTORY — PX: BLADDER SURGERY: SHX569

## 2014-08-17 ENCOUNTER — Other Ambulatory Visit (HOSPITAL_COMMUNITY): Payer: Self-pay | Admitting: Family Medicine

## 2014-08-17 ENCOUNTER — Ambulatory Visit (HOSPITAL_COMMUNITY)
Admission: RE | Admit: 2014-08-17 | Discharge: 2014-08-17 | Disposition: A | Discharge status: Home / Self Care | Payer: 59 | Source: Ambulatory Visit | Attending: Family Medicine | Admitting: Family Medicine

## 2014-08-17 DIAGNOSIS — S93401A Sprain of unspecified ligament of right ankle, initial encounter: Secondary | ICD-10-CM | POA: Insufficient documentation

## 2014-08-17 DIAGNOSIS — X58XXXA Exposure to other specified factors, initial encounter: Secondary | ICD-10-CM | POA: Insufficient documentation

## 2014-08-17 DIAGNOSIS — M25571 Pain in right ankle and joints of right foot: Secondary | ICD-10-CM | POA: Diagnosis present

## 2014-09-21 ENCOUNTER — Encounter: Payer: Self-pay | Admitting: Orthopedic Surgery

## 2014-09-21 ENCOUNTER — Ambulatory Visit (INDEPENDENT_AMBULATORY_CARE_PROVIDER_SITE_OTHER): Payer: 59 | Admitting: Orthopedic Surgery

## 2014-09-21 VITALS — BP 121/72 | Ht 65.0 in | Wt 215.0 lb

## 2014-09-21 DIAGNOSIS — S93601A Unspecified sprain of right foot, initial encounter: Secondary | ICD-10-CM

## 2014-09-21 NOTE — Progress Notes (Signed)
Patient ID: Diamond Mills, female   DOB: October 02, 1955, 59 y.o.   MRN: [id number]  Chief Complaint  Patient presents with  . Foot Pain    right foot pain, DOI 08/14/14, REFERRED BY Diamond Mills    HPI ANYSIA Mills is a 59 y.o. female.  The patient was initially injured on July 2 had x-rays and thought to be negative check continued pain in 2 weeks ago she was placed in a Cam Walker which has made her symptoms much better. She was having a lot of catching swelling pain over the lateral aspect of the right foot. Previous treatments 600 mg ibuprofen rest ice and the walking boot  Review of systems related joint and limb pain back pain numbness and tingling chronic irregular heartbeat.  Review of Systems Review of Systems  Past Medical History  Diagnosis Date  . Essential hypertension, benign   . Gastroesophageal reflux disease     Peptic ulcer disease; Hiatal hernia, EGD normal in 2000; colonoscopy - internal hemorrhoids and 2000  . Irritable bowel syndrome   . Hypothyroidism   . Cerebrovascular accident   . Coronary atherosclerosis of native coronary artery     Nonobstructive in 2002 (50-60% D1, anomalous RCA from [facility] with 30% proximal stenosis)  . Basal cell carcinoma 2000    Right face  . Migraine headache     Occasional  . Degenerative joint disease     Fingers  . Anxiety and depression   . Amaurosis fugax     Post cardiac catheterization 2002; normal carotid ultrasound  . Palpitations   . Diverticulosis     On screening colonoscopy    Past Surgical History  Procedure Laterality Date  . Tubal ligation      Bilateral  . Cholecystectomy    . Shoulder surgery      Spur  . Cardiac catheterization  2002    Multiple procedures prior to 2002; nonobstructive disease  . Colonoscopy  12/11/10    Diverticulosis, random bx negative for microscopic colitis  . Esophagogastroduodenoscopy  12/11/10    Small hh, tiny antral erosions, SB bx negative for celiac  . Tubal ligation    .  Cholecystectomy, laparoscopic      Family History  Problem Relation Age of Onset  . Adopted: Yes  . Colon cancer Neg Hx   . Hypertension Sister     Social History History  Substance Use Topics  . Smoking status: Former Smoker -- 2.00 packs/day for 15 years    Types: Cigarettes    Quit date: 07/13/1981  . Smokeless tobacco: Never Used  . Alcohol Use: No    Allergies  Allergen Reactions  . Contrast Media [Iodinated Diagnostic Agents] Other (See Comments)    Low Blood pressure  . Erythromycin Hives  . Penicillins Hives  . Sulfonamide Derivatives Other (See Comments)    High fever  . Vancomycin Palpitations    Current Outpatient Prescriptions  Medication Sig Dispense Refill  . amLODipine (NORVASC) 5 MG tablet Take 5 mg by mouth daily.      Marland Kitchen aspirin 81 MG tablet Take 81 mg by mouth daily.      . calcium carbonate (OS-CAL) 600 MG TABS tablet Take 600 mg by mouth 2 (two) times daily with a meal.    . cholecalciferol (VITAMIN D) 1000 UNITS tablet Take 2,000 Units by mouth daily.    Marland Kitchen ibuprofen (ADVIL,MOTRIN) 600 MG tablet     . levothyroxine (SYNTHROID, LEVOTHROID) 75 MCG tablet Take 75 mcg  by mouth daily.      Marland Kitchen lisinopril (PRINIVIL,ZESTRIL) 20 MG tablet Take 20 mg by mouth daily.      Marland Kitchen lovastatin (MEVACOR) 10 MG tablet TAKE ONE TABLET BY MOUTH AT BEDTIME 90 tablet 3  . omeprazole (PRILOSEC) 20 MG capsule Take 20 mg by mouth daily.       No current facility-administered medications for this visit.       Physical Exam Physical Exam Blood pressure 121/72, height 5\' 5"  (1.651 m), weight 215 lb (97.523 kg), last menstrual period 12/10/2001. She is awake alert and oriented 3 mood and affect are normal she is a mature with a Cam Walker she has a slight limp her appearance is normal grooming and hygiene.  Inspection of the right foot shows tenderness at the tip of the proximal aspect of the fifth metatarsal painful eversion but normal range of motion stability of the ankle  and foot motor exam shows no weakness in the peroneal tendon skin is normal slight swelling in the foot sensation remains normal and she has a good pulse Data Reviewed X-rays were negative for fracture  Assessment    Sprain right foot possible peroneal tendon strain    Plan    Cam Walker for additional weeks come back for follow-up exam       Arther Abbott 09/21/2014, 2:00 PM

## 2014-09-21 NOTE — Patient Instructions (Signed)
Four more weeks in boot

## 2014-10-19 ENCOUNTER — Ambulatory Visit: Payer: 59 | Admitting: Orthopedic Surgery

## 2014-12-13 ENCOUNTER — Other Ambulatory Visit: Payer: Self-pay | Admitting: Cardiology

## 2015-02-11 ENCOUNTER — Other Ambulatory Visit: Payer: Self-pay | Admitting: Cardiology

## 2015-03-31 ENCOUNTER — Other Ambulatory Visit (HOSPITAL_COMMUNITY): Payer: Self-pay | Admitting: Internal Medicine

## 2015-03-31 DIAGNOSIS — R1084 Generalized abdominal pain: Secondary | ICD-10-CM

## 2015-03-31 DIAGNOSIS — R51 Headache: Principal | ICD-10-CM

## 2015-03-31 DIAGNOSIS — R519 Headache, unspecified: Secondary | ICD-10-CM

## 2015-04-05 ENCOUNTER — Ambulatory Visit (HOSPITAL_COMMUNITY)
Admission: RE | Admit: 2015-04-05 | Discharge: 2015-04-05 | Disposition: A | Discharge status: Home / Self Care | Payer: 59 | Source: Ambulatory Visit | Attending: Internal Medicine | Admitting: Internal Medicine

## 2015-04-05 DIAGNOSIS — R519 Headache, unspecified: Secondary | ICD-10-CM

## 2015-04-05 DIAGNOSIS — Z87891 Personal history of nicotine dependence: Secondary | ICD-10-CM | POA: Insufficient documentation

## 2015-04-05 DIAGNOSIS — R1084 Generalized abdominal pain: Secondary | ICD-10-CM | POA: Insufficient documentation

## 2015-04-05 DIAGNOSIS — N281 Cyst of kidney, acquired: Secondary | ICD-10-CM | POA: Diagnosis not present

## 2015-04-05 DIAGNOSIS — G43909 Migraine, unspecified, not intractable, without status migrainosus: Secondary | ICD-10-CM | POA: Diagnosis not present

## 2015-04-05 DIAGNOSIS — R932 Abnormal findings on diagnostic imaging of liver and biliary tract: Secondary | ICD-10-CM | POA: Insufficient documentation

## 2015-04-05 DIAGNOSIS — R51 Headache: Secondary | ICD-10-CM

## 2015-04-05 DIAGNOSIS — Z9049 Acquired absence of other specified parts of digestive tract: Secondary | ICD-10-CM | POA: Diagnosis not present

## 2015-05-02 ENCOUNTER — Encounter (INDEPENDENT_AMBULATORY_CARE_PROVIDER_SITE_OTHER): Payer: Self-pay | Admitting: *Deleted

## 2015-05-18 ENCOUNTER — Encounter (INDEPENDENT_AMBULATORY_CARE_PROVIDER_SITE_OTHER): Payer: Self-pay | Admitting: Internal Medicine

## 2015-05-18 ENCOUNTER — Ambulatory Visit (INDEPENDENT_AMBULATORY_CARE_PROVIDER_SITE_OTHER): Payer: 59 | Admitting: Internal Medicine

## 2015-05-18 VITALS — BP 120/50 | HR 60 | Temp 98.1°F | Ht 65.0 in | Wt 200.2 lb

## 2015-05-18 DIAGNOSIS — K760 Fatty (change of) liver, not elsewhere classified: Secondary | ICD-10-CM | POA: Diagnosis not present

## 2015-05-18 NOTE — Progress Notes (Addendum)
Subjective:    Patient ID: Diamond Mills, female    DOB: 1956/02/07, 60 y.o.   MRN: [id number]  HPI Referred by Dr. Gerarda Fraction for RUQ pain. She underwent a CT which revealed a fatty liver. She was advised to go on a low carb diet. She has lost 12 pounds which was intentional. She was given an Rx of Dicyclomine QID which she says has helped.  She says she had been having RUQ pain for about a year. The pain is not every day. She may have the pain about 2-3 times a week. 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  Cholecystectomy in the 90s for gallstones.   Her appetite for the most part is good.  She usually has a BM every 3-4 days. Stools are formed. She leans toward constipation.  No melena or BRRB. No Nausea or vomiting. No hematemesis. She walks about three times a week.  She is adopted.   2/6/017 H and H 13.9 and 41.6 ALP 82, AST 19, ALT 13, protein 7.3 04/05/2015 US abdomen: diffuse abdominal pain primarily in the rt upper quadrant for one year. Fatty liver. No focal hepatic abnormality. Prior cholecystectomy. Simple appearing left renal cyst of 6.0cm.  CBC 27mm. No focal hepatic anbormality is seen.  12/11/2010 Ileocolonoscopy with Biopsy: Chronic abdominal pain, diarrhea, negative stool studies; Findings: Left sided diverticulosis. Remainder of colonic mucosa and terminal ileum mucosa appeared otherwise normal.  Biopsy: Ascending and descending biops: benign colonic mucosa.   EGD with bowel biopsy: abdominal pain, chronic diarrhea: Small hiatal hernia. Tiny antral erosions of doubtful clinical significance. Status post duodenal biopsy.  Biopsy: Duodenum: benign small bowel mucosa.  Review of Systems Past Medical History  Diagnosis Date  . Essential hypertension, benign   . Gastroesophageal reflux disease     Peptic ulcer disease; Hiatal hernia, EGD normal in 2000; colonoscopy - internal hemorrhoids and 2000  . Irritable bowel syndrome   . Hypothyroidism   .  Cerebrovascular accident ([place])   . Coronary atherosclerosis of native coronary artery     Nonobstructive in 2002 (50-60% D1, anomalous RCA from [facility] with 30% proximal stenosis)  . Basal cell carcinoma 2000    Right face  . Migraine headache     Occasional  . Degenerative joint disease     Fingers  . Anxiety and depression   . Amaurosis fugax     Post cardiac catheterization 2002; normal carotid ultrasound  . Palpitations   . Diverticulosis     On screening colonoscopy    Past Surgical History  Procedure Laterality Date  . Tubal ligation      Bilateral  . Cholecystectomy    . Shoulder surgery      Spur  . Cardiac catheterization  2002    Multiple procedures prior to 2002; nonobstructive disease  . Colonoscopy  12/11/10    Diverticulosis, random bx negative for microscopic colitis  . Esophagogastroduodenoscopy  12/11/10    Small hh, tiny antral erosions, SB bx negative for celiac  . Tubal ligation    . Cholecystectomy, laparoscopic      Allergies  Allergen Reactions  . Contrast Media [Iodinated Diagnostic Agents] Other (See Comments)    Low Blood pressure  . Erythromycin Hives  . Penicillins Hives  . Sulfonamide Derivatives Other (See Comments)    High fever  . Vancomycin Palpitations    Current Outpatient Prescriptions on File Prior to Visit  Medication Sig Dispense Refill  .  amLODipine (NORVASC) 5 MG tablet Take 5 mg by mouth daily.      Marland Kitchen aspirin 81 MG tablet Take 81 mg by mouth daily.      . calcium carbonate (OS-CAL) 600 MG TABS tablet Take 600 mg by mouth 2 (two) times daily with a meal. With Vitamin D    . levothyroxine (SYNTHROID, LEVOTHROID) 75 MCG tablet Take 75 mcg by mouth daily.      Marland Kitchen lisinopril (PRINIVIL,ZESTRIL) 20 MG tablet Take 20 mg by mouth daily.      Marland Kitchen lovastatin (MEVACOR) 10 MG tablet TAKE ONE TABLET BY MOUTH AT BEDTIME 30 tablet 1  . omeprazole (PRILOSEC) 20 MG capsule Take 20 mg by mouth daily.       No current facility-administered  medications on file prior to visit.        Objective:   Physical Exam Blood pressure 120/50, pulse 60, temperature 98.1 F (36.7 C), height 5\' 5"  (1.651 m), weight 200 lb 3.2 oz (90.81 kg), last menstrual period 12/10/2001. Alert and oriented. Skin warm and dry. Oral mucosa is moist.   . Sclera anicteric, conjunctivae is pink. Thyroid not enlarged. No cervical lymphadenopathy. Lungs clear. Heart regular rate and rhythm.  Abdomen is soft. Bowel sounds are positive. No hepatomegaly. No abdominal masses felt. No tenderness.  No edema to lower extremities.          Assessment & Plan:  Fatty liver with normal liver enzymes. RUQ tenderness probably musculoskeletal in nature.  Will see back in 6 months. Advil for pain as needed. Hepatic function at next office visit.

## 2015-05-18 NOTE — Patient Instructions (Addendum)
OV in 6 months. Lose weight gradually. Hepatic function at OV.  Bentyl twice a day.

## 2015-05-24 ENCOUNTER — Ambulatory Visit (INDEPENDENT_AMBULATORY_CARE_PROVIDER_SITE_OTHER): Payer: 59 | Admitting: Internal Medicine

## 2015-08-01 ENCOUNTER — Other Ambulatory Visit (HOSPITAL_COMMUNITY): Payer: Self-pay | Admitting: Registered Nurse

## 2015-08-01 DIAGNOSIS — Z1231 Encounter for screening mammogram for malignant neoplasm of breast: Secondary | ICD-10-CM

## 2015-08-12 ENCOUNTER — Ambulatory Visit (HOSPITAL_COMMUNITY): Payer: 59

## 2015-08-15 ENCOUNTER — Ambulatory Visit (HOSPITAL_COMMUNITY)
Admission: RE | Admit: 2015-08-15 | Discharge: 2015-08-15 | Disposition: A | Discharge status: Home / Self Care | Payer: 59 | Source: Ambulatory Visit | Attending: Registered Nurse | Admitting: Registered Nurse

## 2015-08-15 DIAGNOSIS — Z1231 Encounter for screening mammogram for malignant neoplasm of breast: Secondary | ICD-10-CM | POA: Diagnosis not present

## 2015-11-17 ENCOUNTER — Ambulatory Visit (INDEPENDENT_AMBULATORY_CARE_PROVIDER_SITE_OTHER): Payer: 59 | Admitting: Internal Medicine

## 2016-05-09 ENCOUNTER — Ambulatory Visit (INDEPENDENT_AMBULATORY_CARE_PROVIDER_SITE_OTHER): Payer: BLUE CROSS/BLUE SHIELD | Admitting: Internal Medicine

## 2016-05-09 ENCOUNTER — Other Ambulatory Visit (INDEPENDENT_AMBULATORY_CARE_PROVIDER_SITE_OTHER): Payer: Self-pay | Admitting: *Deleted

## 2016-05-09 ENCOUNTER — Encounter (INDEPENDENT_AMBULATORY_CARE_PROVIDER_SITE_OTHER): Payer: Self-pay | Admitting: Internal Medicine

## 2016-05-09 VITALS — BP 130/56 | HR 60 | Ht 65.0 in | Wt 153.6 lb

## 2016-05-09 DIAGNOSIS — K760 Fatty (change of) liver, not elsewhere classified: Secondary | ICD-10-CM

## 2016-05-09 NOTE — Patient Instructions (Signed)
Diet and exercise. OV in 1 year.

## 2016-05-09 NOTE — Progress Notes (Signed)
Subjective:    Patient ID: Diamond Mills, female    DOB: 11/07/1955, 61 y.o.   MRN: [id number]  HPI Here today for f/u. She was last seen in March of 2017. She tells me she is doing good.  She has lost from 200 to 153.6 which was intentional.  Her appetite is good.  She is on Keto diet. She is avoiding fatty foods. She is avoiding sugars. She is restricting her carbs. No abdominal pain. She has a BM daily. No melena or BRRB. She is not exercising.   03/21/2015 H and H 13.0 and 41.5 ALP 82, AST 19, ALT 13, protein 7.3 04/05/2015 US abdomen: diffuse abdominal pain primarily in the rt upper quadrant for one year. Fatty liver. No focal hepatic abnormality. Prior cholecystectomy. Simple appearing left renal cyst of 6.0cm.  CBC 35mm. No focal hepatic anbormality is seen.  12/11/2010 Ileocolonoscopy with Biopsy: Chronic abdominal pain, diarrhea, negative stool studies; Findings: Left sided diverticulosis. Remainder of colonic mucosa and terminal ileum mucosa appeared otherwise normal.  Biopsy: Ascending and descending biops: benign colonic mucosa.   EGD with bowel biopsy: abdominal pain, chronic diarrhea: Small hiatal hernia. Tiny antral erosions of doubtful clinical significance. Status post duodenal biopsy.  Biopsy: Duodenum: benign small bowel mucosa. Review of Systems Past Medical History:  Diagnosis Date  . Amaurosis fugax    Post cardiac catheterization 2002; normal carotid ultrasound  . Anxiety and depression   . Basal cell carcinoma 2000   Right face  . Cerebrovascular accident ([place])   . 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 Degenerative joint disease    Fingers  . Diverticulosis    On screening colonoscopy  . Essential hypertension, benign   . Gastroesophageal reflux disease    Peptic ulcer disease; Hiatal hernia, EGD normal in 2000; colonoscopy - internal hemorrhoids  and 2000  . Hypothyroidism   . Irritable bowel syndrome   . Migraine headache    Occasional  . Palpitations     Past Surgical History:  Procedure Laterality Date  . CARDIAC CATHETERIZATION  2002   Multiple procedures prior to 2002; nonobstructive disease  . CHOLECYSTECTOMY    . CHOLECYSTECTOMY, LAPAROSCOPIC    . COLONOSCOPY  12/11/10   Diverticulosis, random bx negative for microscopic colitis  . ESOPHAGOGASTRODUODENOSCOPY  12/11/10   Small hh, tiny antral erosions, SB bx negative for celiac  . SHOULDER SURGERY     Spur  . TUBAL LIGATION     Bilateral  . TUBAL LIGATION      Allergies  Allergen Reactions  . Contrast Media [Iodinated Diagnostic Agents] Other (See Comments)    Low Blood pressure  . Erythromycin Hives  . Penicillins Hives  . Sulfonamide Derivatives Other (See Comments)    High fever  . Vancomycin Palpitations    Current Outpatient Prescriptions on File Prior to Visit  Medication Sig Dispense Refill  . amLODipine (NORVASC) 5 MG tablet Take 5 mg by mouth daily.      Marland Kitchen aspirin 81 MG tablet Take 81 mg by mouth daily.      . calcium carbonate (OS-CAL) 600 MG TABS tablet Take 600 mg by mouth 2 (two) times daily with a meal. With Vitamin D    . dicyclomine (BENTYL) 10 MG capsule Take 10 mg by mouth 2 (two) times daily after a meal.     . docusate sodium (COLACE) 100 MG capsule  Take 100 mg by mouth daily.    Marland Kitchen levothyroxine (SYNTHROID, LEVOTHROID) 75 MCG tablet Take 75 mcg by mouth daily.      Marland Kitchen lisinopril (PRINIVIL,ZESTRIL) 20 MG tablet Take 20 mg by mouth daily.      Marland Kitchen lovastatin (MEVACOR) 10 MG tablet TAKE ONE TABLET BY MOUTH AT BEDTIME 30 tablet 1  . Multiple Vitamin (MULTIVITAMIN) tablet Take 1 tablet by mouth as needed.    Marland Kitchen omeprazole (PRILOSEC) 20 MG capsule Take 20 mg by mouth daily.      . polyethylene glycol (MIRALAX / GLYCOLAX) packet Take 17 g by mouth daily.     No current facility-administered medications on file prior to visit.          Objective:   Physical Exam Blood pressure (!) 130/56, pulse 60, height 5\' 5"  (1.651 m), weight 153 lb 9.6 oz (69.7 kg), last menstrual period 12/10/2001. Alert and oriented. Skin warm and dry. Oral mucosa is moist.   . Sclera anicteric, conjunctivae is pink. Thyroid not enlarged. No cervical lymphadenopathy. Lungs clear. Heart regular rate and rhythm. Murmur heard.   Abdomen is soft. Bowel sounds are positive. No hepatomegaly. No abdominal masses felt. No tenderness.  No edema to lower extremities         Assessment & Plan:  Fatty liver. She is doing well. She has lost from 200 to 153.6. Hepatic function today. She is to continue healthy diet and exercise. OV in 1 year.

## 2016-05-10 LAB — HEPATIC FUNCTION PANEL
ALT: 14 U/L (ref 6–29)
AST: 18 U/L (ref 10–35)
Albumin: 4.1 g/dL (ref 3.6–5.1)
Alkaline Phosphatase: 37 U/L (ref 33–130)
BILIRUBIN TOTAL: 0.7 mg/dL (ref 0.2–1.2)
Bilirubin, Direct: 0.1 mg/dL (ref <=0.2)
Indirect Bilirubin: 0.6 mg/dL (ref 0.2–1.2)
TOTAL PROTEIN: 6.9 g/dL (ref 6.1–8.1)

## 2016-10-04 ENCOUNTER — Ambulatory Visit (HOSPITAL_COMMUNITY)
Admission: RE | Admit: 2016-10-04 | Discharge: 2016-10-04 | Disposition: A | Discharge status: Home / Self Care | Payer: BLUE CROSS/BLUE SHIELD | Source: Ambulatory Visit | Attending: Internal Medicine | Admitting: Internal Medicine

## 2016-10-04 ENCOUNTER — Other Ambulatory Visit (HOSPITAL_COMMUNITY): Payer: Self-pay | Admitting: Internal Medicine

## 2016-10-04 DIAGNOSIS — R079 Chest pain, unspecified: Secondary | ICD-10-CM | POA: Diagnosis not present

## 2016-10-04 DIAGNOSIS — Z1231 Encounter for screening mammogram for malignant neoplasm of breast: Secondary | ICD-10-CM

## 2016-10-10 ENCOUNTER — Ambulatory Visit (HOSPITAL_COMMUNITY)
Admission: RE | Admit: 2016-10-10 | Discharge: 2016-10-10 | Disposition: A | Discharge status: Home / Self Care | Payer: BLUE CROSS/BLUE SHIELD | Source: Ambulatory Visit | Attending: Internal Medicine | Admitting: Internal Medicine

## 2016-10-10 DIAGNOSIS — Z1231 Encounter for screening mammogram for malignant neoplasm of breast: Secondary | ICD-10-CM | POA: Diagnosis not present

## 2016-12-18 ENCOUNTER — Other Ambulatory Visit (HOSPITAL_COMMUNITY): Payer: Self-pay | Admitting: Physician Assistant

## 2016-12-18 ENCOUNTER — Ambulatory Visit (HOSPITAL_COMMUNITY)
Admission: RE | Admit: 2016-12-18 | Discharge: 2016-12-18 | Disposition: A | Discharge status: Home / Self Care | Payer: BLUE CROSS/BLUE SHIELD | Source: Ambulatory Visit | Attending: Physician Assistant | Admitting: Physician Assistant

## 2016-12-18 DIAGNOSIS — M47816 Spondylosis without myelopathy or radiculopathy, lumbar region: Secondary | ICD-10-CM | POA: Diagnosis not present

## 2016-12-18 DIAGNOSIS — M25511 Pain in right shoulder: Secondary | ICD-10-CM | POA: Diagnosis not present

## 2016-12-18 DIAGNOSIS — M25551 Pain in right hip: Secondary | ICD-10-CM | POA: Diagnosis not present

## 2016-12-18 DIAGNOSIS — E663 Overweight: Secondary | ICD-10-CM | POA: Insufficient documentation

## 2018-03-17 ENCOUNTER — Other Ambulatory Visit (HOSPITAL_COMMUNITY): Payer: Self-pay | Admitting: Physician Assistant

## 2018-03-17 ENCOUNTER — Ambulatory Visit (HOSPITAL_COMMUNITY)
Admission: RE | Admit: 2018-03-17 | Discharge: 2018-03-17 | Disposition: A | Discharge status: Home / Self Care | Payer: 59 | Source: Ambulatory Visit | Attending: Physician Assistant | Admitting: Physician Assistant

## 2018-03-17 DIAGNOSIS — R0781 Pleurodynia: Secondary | ICD-10-CM

## 2018-04-07 ENCOUNTER — Other Ambulatory Visit: Payer: Self-pay | Admitting: Family Medicine

## 2018-04-07 ENCOUNTER — Other Ambulatory Visit (HOSPITAL_COMMUNITY): Payer: Self-pay | Admitting: Family Medicine

## 2018-04-07 ENCOUNTER — Ambulatory Visit (HOSPITAL_COMMUNITY)
Admission: RE | Admit: 2018-04-07 | Discharge: 2018-04-07 | Disposition: A | Discharge status: Home / Self Care | Payer: 59 | Source: Ambulatory Visit | Attending: Family Medicine | Admitting: Family Medicine

## 2018-04-07 DIAGNOSIS — R51 Headache: Secondary | ICD-10-CM | POA: Diagnosis not present

## 2018-04-07 DIAGNOSIS — F05 Delirium due to known physiological condition: Secondary | ICD-10-CM

## 2018-04-07 DIAGNOSIS — R410 Disorientation, unspecified: Secondary | ICD-10-CM

## 2018-04-07 DIAGNOSIS — R519 Headache, unspecified: Secondary | ICD-10-CM

## 2018-04-07 DIAGNOSIS — F0151 Vascular dementia with behavioral disturbance: Secondary | ICD-10-CM

## 2018-09-25 ENCOUNTER — Other Ambulatory Visit (HOSPITAL_COMMUNITY): Payer: Self-pay | Admitting: Family Medicine

## 2018-09-25 DIAGNOSIS — Z1231 Encounter for screening mammogram for malignant neoplasm of breast: Secondary | ICD-10-CM

## 2018-09-25 DIAGNOSIS — E2839 Other primary ovarian failure: Secondary | ICD-10-CM

## 2018-10-09 ENCOUNTER — Ambulatory Visit (HOSPITAL_COMMUNITY)
Admission: RE | Admit: 2018-10-09 | Discharge: 2018-10-09 | Disposition: A | Discharge status: Home / Self Care | Payer: 59 | Source: Ambulatory Visit | Attending: Family Medicine | Admitting: Family Medicine

## 2018-10-09 ENCOUNTER — Other Ambulatory Visit: Payer: Self-pay

## 2018-10-09 DIAGNOSIS — Z1231 Encounter for screening mammogram for malignant neoplasm of breast: Secondary | ICD-10-CM

## 2018-10-09 DIAGNOSIS — E2839 Other primary ovarian failure: Secondary | ICD-10-CM | POA: Diagnosis not present

## 2019-06-28 ENCOUNTER — Other Ambulatory Visit: Payer: Self-pay

## 2019-06-28 ENCOUNTER — Emergency Department (HOSPITAL_COMMUNITY)
Admission: EM | Admit: 2019-06-28 | Discharge: 2019-06-28 | Disposition: A | Discharge status: Home / Self Care | Payer: Medicare Other | Attending: Emergency Medicine | Admitting: Emergency Medicine

## 2019-06-28 ENCOUNTER — Emergency Department (HOSPITAL_COMMUNITY): Payer: Medicare Other

## 2019-06-28 ENCOUNTER — Encounter (HOSPITAL_COMMUNITY): Payer: Self-pay

## 2019-06-28 DIAGNOSIS — Z85828 Personal history of other malignant neoplasm of skin: Secondary | ICD-10-CM | POA: Diagnosis not present

## 2019-06-28 DIAGNOSIS — I2510 Atherosclerotic heart disease of native coronary artery without angina pectoris: Secondary | ICD-10-CM | POA: Diagnosis not present

## 2019-06-28 DIAGNOSIS — Z79899 Other long term (current) drug therapy: Secondary | ICD-10-CM | POA: Insufficient documentation

## 2019-06-28 DIAGNOSIS — S32040A Wedge compression fracture of fourth lumbar vertebra, initial encounter for closed fracture: Secondary | ICD-10-CM | POA: Insufficient documentation

## 2019-06-28 DIAGNOSIS — Y9389 Activity, other specified: Secondary | ICD-10-CM | POA: Insufficient documentation

## 2019-06-28 DIAGNOSIS — Y929 Unspecified place or not applicable: Secondary | ICD-10-CM | POA: Insufficient documentation

## 2019-06-28 DIAGNOSIS — Z7982 Long term (current) use of aspirin: Secondary | ICD-10-CM | POA: Insufficient documentation

## 2019-06-28 DIAGNOSIS — Z87891 Personal history of nicotine dependence: Secondary | ICD-10-CM | POA: Diagnosis not present

## 2019-06-28 DIAGNOSIS — W010XXA Fall on same level from slipping, tripping and stumbling without subsequent striking against object, initial encounter: Secondary | ICD-10-CM | POA: Insufficient documentation

## 2019-06-28 DIAGNOSIS — I10 Essential (primary) hypertension: Secondary | ICD-10-CM | POA: Insufficient documentation

## 2019-06-28 DIAGNOSIS — Y998 Other external cause status: Secondary | ICD-10-CM | POA: Insufficient documentation

## 2019-06-28 DIAGNOSIS — E039 Hypothyroidism, unspecified: Secondary | ICD-10-CM | POA: Insufficient documentation

## 2019-06-28 DIAGNOSIS — S3992XA Unspecified injury of lower back, initial encounter: Secondary | ICD-10-CM | POA: Diagnosis present

## 2019-06-28 HISTORY — DX: Fatty (change of) liver, not elsewhere classified: K76.0

## 2019-06-28 MED ORDER — TRAMADOL HCL 50 MG PO TABS: 50.0000 mg | ORAL_TABLET | Freq: Four times a day (QID) | ORAL | 0 refills | Status: DC | PRN

## 2019-06-28 NOTE — ED Provider Notes (Signed)
[facility] EMERGENCY DEPARTMENT Provider Note   CSN: [id number] Arrival date & time: 06/28/19  0844     History Chief Complaint  Patient presents with  . Back Pain    Diamond Mills is a 64 y.o. female.  Patient daughters dog.  She landed on her buttocks.  But she is having a lot of lower lumbar pain.  No numbness or weakness to her lower extremities.  No other injuries.  Did not hit her head.  No loss of consciousness.  Was knocked over by her        Past Medical History:  Diagnosis Date  . Amaurosis fugax    Post cardiac catheterization 2002; normal carotid ultrasound  . Anxiety and depression   . Basal cell carcinoma 2000   Right face  . Cerebrovascular accident ([place])   . 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 Degenerative joint disease    Fingers  . Diverticulosis    On screening colonoscopy  . Essential hypertension, benign   . Fatty liver   . Gastroesophageal reflux disease    Peptic ulcer disease; Hiatal hernia, EGD normal in 2000; colonoscopy - internal hemorrhoids and 2000  . Hypothyroidism   . Irritable bowel syndrome   . Migraine headache    Occasional  . Palpitations     Patient Active Problem List   Diagnosis Date Noted  . Preoperative cardiovascular examination 03/20/2013  . Migraine 03/19/2013  . Palpitations 03/19/2013  . Gastroesophageal reflux disease   . Essential hypertension, benign   . Hypothyroidism   . Coronary atherosclerosis of native coronary artery   . IBS (irritable bowel syndrome) 01/16/2011    Past Surgical History:  Procedure Laterality Date  . CARDIAC CATHETERIZATION  2002   Multiple procedures prior to 2002; nonobstructive disease  . CHOLECYSTECTOMY    . CHOLECYSTECTOMY, LAPAROSCOPIC    . COLONOSCOPY  12/11/10   Diverticulosis, random bx negative for microscopic colitis  . ESOPHAGOGASTRODUODENOSCOPY  12/11/10    Small hh, tiny antral erosions, SB bx negative for celiac  . SHOULDER SURGERY     Spur  . TUBAL LIGATION     Bilateral  . TUBAL LIGATION       OB History   No obstetric history on file.     Family History  Adopted: Yes  Problem Relation Age of Onset  . Hypertension Sister   . Colon cancer Neg Hx     Social History   Tobacco Use  . Smoking status: Former Smoker    Packs/day: 2.00    Years: 15.00    Pack years: 30.00    Types: Cigarettes    Quit date: 07/13/1981    Years since quitting: 37.9  . Smokeless tobacco: Never Used  Substance Use Topics  . Alcohol use: No  . Drug use: No    Home Medications Prior to Admission medications   Medication Sig Start Date End Date Taking? Authorizing Provider  amLODipine (NORVASC) 5 MG tablet Take 5 mg by mouth daily.     Yes [provider]  aspirin 81 MG tablet Take 81 mg by mouth daily.     Yes [provider]  calcium carbonate (OS-CAL) 600 MG TABS tablet Take 600 mg by mouth 2 (two) times daily with a meal. With Vitamin D   Yes [provider]  Coenzyme Q10 (CO Q 10 PO) Take 1 tablet by mouth  at bedtime.   Yes [provider]  ELDERBERRY PO Take 2 tablets by mouth at bedtime.   Yes [provider]  levothyroxine (SYNTHROID) 100 MCG tablet Take 100 mcg by mouth daily before breakfast.   Yes [provider]  lisinopril (PRINIVIL,ZESTRIL) 20 MG tablet Take 20 mg by mouth daily.     Yes [provider]  lovastatin (MEVACOR) 10 MG tablet TAKE ONE TABLET BY MOUTH AT BEDTIME 12/13/14  Yes Satira Sark, MD  Multiple Vitamin (MULTIVITAMIN) tablet Take 1 tablet by mouth as needed.   Yes [provider]  omeprazole (PRILOSEC) 20 MG capsule Take 20 mg by mouth daily.     Yes [provider]    Allergies    Contrast media [iodinated diagnostic agents], Erythromycin, Penicillins, Sulfonamide derivatives, and Vancomycin  Review of Systems   Review of  Systems  Constitutional: Negative for chills and fever.  HENT: Negative for rhinorrhea and sore throat.   Eyes: Negative for visual disturbance.  Respiratory: Negative for cough and shortness of breath.   Cardiovascular: Negative for chest pain and leg swelling.  Gastrointestinal: Negative for abdominal pain, diarrhea, nausea and vomiting.  Genitourinary: Negative for dysuria.  Musculoskeletal: Positive for back pain. Negative for neck pain.  Skin: Negative for rash.  Neurological: Negative for dizziness, weakness, light-headedness, numbness and headaches.  Hematological: Does not bruise/bleed easily.  Psychiatric/Behavioral: Negative for confusion.    Physical Exam Updated Vital Signs BP (!) 162/79 (BP Location: Left Arm)   Pulse 83   Temp 98.1 F (36.7 C) (Oral)   Resp 18   Ht 1.626 m (5\' 4" )   Wt 72.6 kg   LMP 12/10/2001   SpO2 96%   BMI 27.46 kg/m   Physical Exam Vitals and nursing note reviewed.  Constitutional:      General: She is not in acute distress.    Appearance: Normal appearance. She is well-developed.  HENT:     Head: Normocephalic and atraumatic.  Eyes:     Conjunctiva/sclera: Conjunctivae normal.  Cardiovascular:     Rate and Rhythm: Normal rate and regular rhythm.     Heart sounds: No murmur.  Pulmonary:     Effort: Pulmonary effort is normal. No respiratory distress.     Breath sounds: Normal breath sounds.  Abdominal:     Palpations: Abdomen is soft.     Tenderness: There is no abdominal tenderness.  Musculoskeletal:        General: Tenderness present. No deformity.     Cervical back: Normal range of motion and neck supple.  Skin:    General: Skin is warm and dry.  Neurological:     General: No focal deficit present.     Mental Status: She is alert and oriented to person, place, and time.     Cranial Nerves: No cranial nerve deficit.     Sensory: No sensory deficit.     Motor: No weakness.     ED Results / Procedures / Treatments    Labs (all labs ordered are listed, but only abnormal results are displayed) Labs Reviewed - No data to display  EKG None  Radiology No results found.  Procedures Procedures (including critical care time)  Medications Ordered in ED Medications - No data to display  ED Course  I have reviewed the triage vital signs and the nursing notes.  Pertinent labs & imaging results that were available during my care of the patient were reviewed by me and considered in my  medical decision making (see chart for details).    MDM Rules/Calculators/A&P                      X-rays show evidence of an acute L4 lumbar fracture.  Patient be treated with tramadol.  Patient states she is already been taking Advil on a regular basis for this pain.  Did give patient precautions about long-term use of nonsteroidals with risk for heart attack and or stroke.  But probably okay to take it in the short run.  Will give patient follow-up with orthopedics referral to Dr. Aline Brochure locally provided.  But in the remote past she had been seen by an orthopedic doctor in [place] which is fine to call them as well.  Did discuss with patient kyphoplasty gluing procedure. Not sure whether she would be a candidate for that or not.      Final Clinical Impression(s) / ED Diagnoses Final diagnoses:  None    Rx / DC Orders ED Discharge Orders    None       Fredia Sorrow, MD 06/28/19 1046

## 2019-06-28 NOTE — ED Triage Notes (Addendum)
Pt reports being jumped on by dog and she fell backwards and landed on buttocks, then fell on back. Occurred yesterday. Pt reports her pharmacy is closed and requests written script

## 2019-06-28 NOTE — Discharge Instructions (Signed)
Make an appointment to follow-up with orthopedics either Dr. Aline Brochure here locally or with your previous orthopedic doctor you have seen and [facility].  Take the tramadol as needed for breakthrough pain.  Would recommend taking Aleve in the short run every 12 hours for the pain but Advil is okay.  X-rays did show evidence of an L4 compression fracture.  Return for any new or worse symptoms.

## 2019-07-24 ENCOUNTER — Other Ambulatory Visit: Payer: Self-pay | Admitting: Orthopedic Surgery

## 2019-08-07 NOTE — Progress Notes (Signed)
[place] [address] Phone: [phone] Fax: [phone]      Your procedure is scheduled on June 30,2021.  Report to [facility] Main Entrance "A" at 05:30 A.M., and check in at the Admitting office.  Call this number if you have problems the morning of surgery:  [phone]  Call [phone] if you have any questions prior to your surgery date Monday-Friday 8am-4pm    Remember:  Do not eat after midnight the night before your surgery  You may drink clear liquids until 04:30 am the morning of your surgery.   Clear liquids allowed are: Water, Non-Citrus Juices (without pulp), Carbonated Beverages, Clear Tea, Black Coffee Only, and Gatorade.    Enhanced Recovery after Surgery for Orthopedics Enhanced Recovery after Surgery is a protocol used to improve the stress on your body and your recovery after surgery.  Patient Instructions  . The night before surgery:  o No food after midnight. ONLY clear liquids after midnight  .  Marland Kitchen The day of surgery (if you do NOT have diabetes):  o Drink ONE (1) Pre-Surgery Clear Ensure as directed.   o This drink was given to you during your hospital  pre-op appointment visit. o The pre-op nurse will instruct you on the time to drink the  Pre-Surgery Ensure depending on your surgery time. o Finish the drink at [employer] time. FINISH by 4:30 AM the morning of surgery. o Nothing else to drink after completing the  Pre-Surgery Clear Ensure.    Take these medicines the morning of surgery with A SIP OF WATER : Amlodipine (Norvasc) Levothyroxine (Synthroid) Omeprazole (Prilosec)  If Needed: Methocarbamol (Robaxin)  As of today, STOP taking any Aspirin (unless otherwise instructed by your surgeon) and Aspirin containing products, Aleve, Naproxen, Ibuprofen, Motrin, Advil, Goody's, BC's, all herbal medications, fish oil, and all vitamins.                       Do not wear jewelry, make up, or nail polish            Do not wear lotions, powders, perfumes/colognes, or deodorant.            Do not shave 48 hours prior to surgery.             Do not bring valuables to the hospital.            [facility] is not responsible for any belongings or valuables.  Do NOT Smoke (Tobacco/Vapping) or drink Alcohol 24 hours prior to your procedure If you use a CPAP at night, you may bring all equipment for your overnight stay.   Contacts, glasses, dentures or bridgework may not be worn into surgery.      For patients admitted to the hospital, discharge time will be determined by your treatment team.   Patients discharged the day of surgery will not be allowed to drive home, and someone needs to stay with them for 24 hours.   Special instructions:   [facility]- Preparing For Surgery  Before surgery, you can play an important role. Because skin is not sterile, your skin needs to be as free of germs as possible. You can reduce the number of germs on your skin by washing with CHG (chlorahexidine gluconate) Soap before surgery.  CHG is an antiseptic cleaner which kills germs and bonds with the skin to continue killing germs even after washing.  Oral Hygiene is also important to reduce your risk of infection.  Remember - BRUSH YOUR TEETH THE MORNING OF SURGERY WITH YOUR REGULAR TOOTHPASTE  Please do not use if you have an allergy to CHG or antibacterial soaps. If your skin becomes reddened/irritated stop using the CHG.  Do not shave (including legs and underarms) for at least 48 hours prior to first CHG shower. It is OK to shave your face.  Please follow these instructions carefully.   1. Shower the NIGHT BEFORE SURGERY and the MORNING OF SURGERY with CHG Soap.   2. If you chose to wash your hair, wash your hair first as usual with your normal shampoo.  3. After you shampoo, rinse your hair and body thoroughly to remove the shampoo.  4. Use CHG as you would  any other liquid soap. You can apply CHG directly to the skin and wash gently with a scrungie or a clean washcloth.   5. Apply the CHG Soap to your body ONLY FROM THE NECK DOWN.  Do not use on open wounds or open sores. Avoid contact with your eyes, ears, mouth and genitals (private parts). Wash Face and genitals (private parts)  with your normal soap.   6. Wash thoroughly, paying special attention to the area where your surgery will be performed.  7. Thoroughly rinse your body with warm water from the neck down.  8. DO NOT shower/wash with your normal soap after using and rinsing off the CHG Soap.  9. Pat yourself dry with a CLEAN TOWEL.  10. Wear CLEAN PAJAMAS to bed the night before surgery, wear comfortable clothes the morning of surgery  11. Place CLEAN SHEETS on your bed the night of your first shower and DO NOT SLEEP WITH PETS.   Day of Surgery:   Do not apply any deodorants/lotions.  Please wear clean clothes to the hospital/surgery center.   Remember to brush your teeth WITH YOUR REGULAR TOOTHPASTE.   Please read over the following fact sheets that you were given.

## 2019-08-10 ENCOUNTER — Encounter (HOSPITAL_COMMUNITY): Payer: Self-pay

## 2019-08-10 ENCOUNTER — Other Ambulatory Visit (HOSPITAL_COMMUNITY)
Admission: RE | Admit: 2019-08-10 | Discharge: 2019-08-10 | Disposition: A | Discharge status: Home / Self Care | Payer: Medicare Other | Source: Ambulatory Visit | Attending: Orthopedic Surgery | Admitting: Orthopedic Surgery

## 2019-08-10 ENCOUNTER — Encounter (HOSPITAL_COMMUNITY)
Admission: RE | Admit: 2019-08-10 | Discharge: 2019-08-10 | Disposition: A | Discharge status: Home / Self Care | Payer: Medicare Other | Source: Ambulatory Visit | Attending: Orthopedic Surgery | Admitting: Orthopedic Surgery

## 2019-08-10 ENCOUNTER — Other Ambulatory Visit: Payer: Self-pay

## 2019-08-10 DIAGNOSIS — Z20822 Contact with and (suspected) exposure to covid-19: Secondary | ICD-10-CM | POA: Diagnosis not present

## 2019-08-10 DIAGNOSIS — I2510 Atherosclerotic heart disease of native coronary artery without angina pectoris: Secondary | ICD-10-CM | POA: Diagnosis not present

## 2019-08-10 DIAGNOSIS — K760 Fatty (change of) liver, not elsewhere classified: Secondary | ICD-10-CM | POA: Insufficient documentation

## 2019-08-10 DIAGNOSIS — I10 Essential (primary) hypertension: Secondary | ICD-10-CM | POA: Diagnosis not present

## 2019-08-10 DIAGNOSIS — Z01818 Encounter for other preprocedural examination: Secondary | ICD-10-CM | POA: Insufficient documentation

## 2019-08-10 LAB — CBC WITH DIFFERENTIAL/PLATELET
Abs Immature Granulocytes: 0.04 10*3/uL (ref 0.00–0.07)
Basophils Absolute: 0.1 10*3/uL (ref 0.0–0.1)
Basophils Relative: 1 %
Eosinophils Absolute: 0.2 10*3/uL (ref 0.0–0.5)
Eosinophils Relative: 2 %
HCT: 42.8 % (ref 36.0–46.0)
Hemoglobin: 13.8 g/dL (ref 12.0–15.0)
Immature Granulocytes: 0 %
Lymphocytes Relative: 30 %
Lymphs Abs: 2.9 10*3/uL (ref 0.7–4.0)
MCH: 31.7 pg (ref 26.0–34.0)
MCHC: 32.2 g/dL (ref 30.0–36.0)
MCV: 98.4 fL (ref 80.0–100.0)
Monocytes Absolute: 0.8 10*3/uL (ref 0.1–1.0)
Monocytes Relative: 9 %
Neutro Abs: 5.6 10*3/uL (ref 1.7–7.7)
Neutrophils Relative %: 58 %
Platelets: 265 10*3/uL (ref 150–400)
RBC: 4.35 MIL/uL (ref 3.87–5.11)
RDW: 13 % (ref 11.5–15.5)
WBC: 9.7 10*3/uL (ref 4.0–10.5)
nRBC: 0 % (ref 0.0–0.2)

## 2019-08-10 LAB — COMPREHENSIVE METABOLIC PANEL
ALT: 19 U/L (ref 0–44)
AST: 23 U/L (ref 15–41)
Albumin: 4 g/dL (ref 3.5–5.0)
Alkaline Phosphatase: 79 U/L (ref 38–126)
Anion gap: 10 (ref 5–15)
BUN: 16 mg/dL (ref 8–23)
CO2: 27 mmol/L (ref 22–32)
Calcium: 9.8 mg/dL (ref 8.9–10.3)
Chloride: 103 mmol/L (ref 98–111)
Creatinine, Ser: 1 mg/dL (ref 0.44–1.00)
GFR calc Af Amer: >60 mL/min (ref >60)
GFR calc non Af Amer: 59 mL/min — ABNORMAL LOW (ref >60)
Glucose, Bld: 103 mg/dL — ABNORMAL HIGH (ref 70–99)
Potassium: 4.3 mmol/L (ref 3.5–5.1)
Sodium: 140 mmol/L (ref 135–145)
Total Bilirubin: 1.4 mg/dL — ABNORMAL HIGH (ref 0.3–1.2)
Total Protein: 7.3 g/dL (ref 6.5–8.1)

## 2019-08-10 LAB — PROTIME-INR
INR: 1.1 (ref 0.8–1.2)
Prothrombin Time: 13.4 s (ref 11.4–15.2)

## 2019-08-10 LAB — TYPE AND SCREEN
ABO/RH(D): A POS
Antibody Screen: NEGATIVE

## 2019-08-10 LAB — ABO/RH: ABO/RH(D): A POS

## 2019-08-10 LAB — SURGICAL PCR SCREEN
MRSA, PCR: NEGATIVE
Staphylococcus aureus: NEGATIVE

## 2019-08-10 LAB — SARS CORONAVIRUS 2 (TAT 6-24 HRS): SARS Coronavirus 2: NEGATIVE

## 2019-08-10 LAB — APTT: aPTT: 25 s (ref 24–36)

## 2019-08-10 NOTE — Progress Notes (Signed)
Pt did not provide enough urine for adequate testing. Will need to recollect and run STAT on the DOS.  Jacqlyn Larsen, RN

## 2019-08-10 NOTE — Progress Notes (Addendum)
PCP - Dr. Redmond School Cardiologist - Dr. Rozann Lesches  PPM/ICD - N/A Device Orders -N/A  Rep Notified - N/A  Chest x-ray - N/A EKG - 08/10/19 Stress Test - 10+ years ago ECHO - 2015 Cardiac Cath -2002   Sleep Study - denies CPAP - denies  Blood Thinner Instructions: N/A Aspirin Instructions: LD 08/04/19  ERAS Protcol -Protocol initiated. Pt instructed to cease clears by 0430 the morning of surgery.  PRE-SURGERY Ensure or G2- Pt provided with (1) pre-surgical Ensure.   COVID TEST- 08/10/19 after PAT appointment. Pt educated on quarantine requirements.    Anesthesia review: Yes. Hx of CAD  Patient denies shortness of breath, fever, cough and chest pain at PAT appointment   All instructions explained to the patient, with a verbal understanding of the material. Patient agrees to go over the instructions while at home for a better understanding. Patient also instructed to self quarantine after being tested for COVID-19. The opportunity to ask questions was provided.   Coronavirus Screening  Have you experienced the following symptoms:  Cough yes/no: No Fever (>100.25F)  yes/no: No Runny nose yes/no: No Sore throat yes/no: No Difficulty breathing/shortness of breath  yes/no: No  Have you or a family member traveled in the last 14 days and where? yes/no: No   If the patient indicates "YES" to the above questions, their PAT will be rescheduled to limit the exposure to others and, the surgeon will be notified. THE PATIENT WILL NEED TO BE ASYMPTOMATIC FOR 14 DAYS.   If the patient is not experiencing any of these symptoms, the PAT nurse will instruct them to NOT bring anyone with them to their appointment since they may have these symptoms or traveled as well.   Please remind your patients and families that hospital visitation restrictions are in effect and the importance of the restrictions.

## 2019-08-11 NOTE — Anesthesia Preprocedure Evaluation (Addendum)
Anesthesia Evaluation  Patient identified by MRN, date of birth, ID band Patient awake    Reviewed: Allergy & Precautions, NPO status , Patient's Chart, lab work & pertinent test results  Airway Mallampati: II  TM Distance: >3 FB Neck ROM: Full    Dental  (+) Edentulous Upper, Poor Dentition, Dental Advisory Given   Pulmonary neg pulmonary ROS, former smoker,    Pulmonary exam normal breath sounds clear to auscultation       Cardiovascular hypertension, Pt. on medications + CAD  Normal cardiovascular exam Rhythm:Regular Rate:Normal  EKG 08/11/19: Normal sinus rhythm. Rate 77. Nonspecific ST abnormality  TTE 2015 - Mild LVH with LVEF 68-37%, grade 1 diastolic dysfunction. Moderate left atrial enlargement. Mildly thickened mitral leaflets with trivial mitral regurgitation. Mildlysclerotic aortic valve with trace aortic regurgitation.Trivial tricuspid regurgitation with upper normal PASP of 34 mm mercury   Cath 10/02/2000: LEFT VENTRICULOGRAM: Normal end-systolic and end-diastolic dimensions. Overall left ventricular function is well preserved, ejection fraction of greater than 55%. No mitral regurgitation. LV pressure is 180/0, aortic is 180/90, LVEDP equals 30.   Neuro/Psych  Headaches, PSYCHIATRIC DISORDERS Anxiety Depression    GI/Hepatic Neg liver ROS, GERD  Medicated,  Endo/Other  Hypothyroidism   Renal/GU negative Renal ROS  negative genitourinary   Musculoskeletal  (+) Arthritis ,   Abdominal   Peds  Hematology negative hematology ROS (+)   Anesthesia Other Findings   Reproductive/Obstetrics                           Anesthesia Physical Anesthesia Plan  ASA: II  Anesthesia Plan: General   Post-op Pain Management:    Induction: Intravenous  PONV Risk Score and Plan: 3 and Midazolam, Dexamethasone and Ondansetron  Airway Management Planned: Oral ETT  Additional Equipment:    Intra-op Plan:   Post-operative Plan: Extubation in OR  Informed Consent: I have reviewed the patients History and Physical, chart, labs and discussed the procedure including the risks, benefits and alternatives for the proposed anesthesia with the patient or authorized representative who has indicated his/her understanding and acceptance.     Dental advisory given  Plan Discussed with: CRNA  Anesthesia Plan Comments:        Anesthesia Quick Evaluation

## 2019-08-11 NOTE — Progress Notes (Signed)
Anesthesia Chart Review:  Remote history of cardiac eval with documented nonobstructive CAD by cath 2002. She was last seen in 2015 for preop clearance prior to hysterectomy. At that time an echocardiogram was performed showing EF 18-86%, mild diastolic dysfunction, no significant valvular abnormalities.  Denied any CP or SOB at PAT appointment.  Hx of fatty liver, preop labs show hepatic function WNL. Remainder of preop labs unremarkable.  EKG 08/11/19: Normal sinus rhythm. Rate 77. Nonspecific ST abnormality  Cath 10/02/2000: LEFT VENTRICULOGRAM:  Normal end-systolic and end-diastolic dimensions. Overall left ventricular function is well preserved, ejection fraction of greater than 55%.  No mitral regurgitation.  LV pressure is 180/0, aortic is 180/90, LVEDP equals 30.  ASSESSMENT AND PLAN:  The patient is a 64 year old female, who presents with noncritical coronary artery disease.  Review of catheterization films from August 01, 2000, shows no progression of disease in the diagonal branch.  At this point continued medical therapy will be recommended.  Wynonia Musty [facility] Stay [facility]/Anesthesiology Phone [phone] 08/11/2019 9:23 AM

## 2019-08-12 ENCOUNTER — Ambulatory Visit (HOSPITAL_COMMUNITY): Payer: Medicare Other | Admitting: Physician Assistant

## 2019-08-12 ENCOUNTER — Ambulatory Visit (HOSPITAL_COMMUNITY): Payer: Medicare Other

## 2019-08-12 ENCOUNTER — Ambulatory Visit (HOSPITAL_COMMUNITY)
Admission: RE | Admit: 2019-08-12 | Discharge: 2019-08-12 | Disposition: A | Discharge status: Home / Self Care | Payer: Medicare Other | Attending: Orthopedic Surgery | Admitting: Orthopedic Surgery

## 2019-08-12 ENCOUNTER — Other Ambulatory Visit: Payer: Self-pay

## 2019-08-12 ENCOUNTER — Ambulatory Visit (HOSPITAL_COMMUNITY): Payer: Medicare Other | Admitting: Anesthesiology

## 2019-08-12 ENCOUNTER — Encounter (HOSPITAL_COMMUNITY): Payer: Self-pay | Admitting: Orthopedic Surgery

## 2019-08-12 ENCOUNTER — Encounter (HOSPITAL_COMMUNITY)
Admission: RE | Disposition: A | Discharge status: Home / Self Care | Payer: Self-pay | Source: Home / Self Care | Attending: Orthopedic Surgery

## 2019-08-12 DIAGNOSIS — M4854XA Collapsed vertebra, not elsewhere classified, thoracic region, initial encounter for fracture: Secondary | ICD-10-CM | POA: Insufficient documentation

## 2019-08-12 DIAGNOSIS — Z8249 Family history of ischemic heart disease and other diseases of the circulatory system: Secondary | ICD-10-CM | POA: Insufficient documentation

## 2019-08-12 DIAGNOSIS — I2510 Atherosclerotic heart disease of native coronary artery without angina pectoris: Secondary | ICD-10-CM | POA: Insufficient documentation

## 2019-08-12 DIAGNOSIS — Z8673 Personal history of transient ischemic attack (TIA), and cerebral infarction without residual deficits: Secondary | ICD-10-CM | POA: Insufficient documentation

## 2019-08-12 DIAGNOSIS — Z79899 Other long term (current) drug therapy: Secondary | ICD-10-CM | POA: Insufficient documentation

## 2019-08-12 DIAGNOSIS — I358 Other nonrheumatic aortic valve disorders: Secondary | ICD-10-CM | POA: Insufficient documentation

## 2019-08-12 DIAGNOSIS — Z91041 Radiographic dye allergy status: Secondary | ICD-10-CM | POA: Diagnosis not present

## 2019-08-12 DIAGNOSIS — F419 Anxiety disorder, unspecified: Secondary | ICD-10-CM | POA: Diagnosis not present

## 2019-08-12 DIAGNOSIS — Z85828 Personal history of other malignant neoplasm of skin: Secondary | ICD-10-CM | POA: Diagnosis not present

## 2019-08-12 DIAGNOSIS — E039 Hypothyroidism, unspecified: Secondary | ICD-10-CM | POA: Diagnosis not present

## 2019-08-12 DIAGNOSIS — Z880 Allergy status to penicillin: Secondary | ICD-10-CM | POA: Insufficient documentation

## 2019-08-12 DIAGNOSIS — M1990 Unspecified osteoarthritis, unspecified site: Secondary | ICD-10-CM | POA: Diagnosis not present

## 2019-08-12 DIAGNOSIS — M4856XA Collapsed vertebra, not elsewhere classified, lumbar region, initial encounter for fracture: Secondary | ICD-10-CM | POA: Diagnosis present

## 2019-08-12 DIAGNOSIS — Z882 Allergy status to sulfonamides status: Secondary | ICD-10-CM | POA: Insufficient documentation

## 2019-08-12 DIAGNOSIS — G43909 Migraine, unspecified, not intractable, without status migrainosus: Secondary | ICD-10-CM | POA: Diagnosis not present

## 2019-08-12 DIAGNOSIS — K760 Fatty (change of) liver, not elsewhere classified: Secondary | ICD-10-CM | POA: Insufficient documentation

## 2019-08-12 DIAGNOSIS — Z87891 Personal history of nicotine dependence: Secondary | ICD-10-CM | POA: Insufficient documentation

## 2019-08-12 DIAGNOSIS — K219 Gastro-esophageal reflux disease without esophagitis: Secondary | ICD-10-CM | POA: Insufficient documentation

## 2019-08-12 DIAGNOSIS — I10 Essential (primary) hypertension: Secondary | ICD-10-CM | POA: Diagnosis not present

## 2019-08-12 DIAGNOSIS — K589 Irritable bowel syndrome without diarrhea: Secondary | ICD-10-CM | POA: Insufficient documentation

## 2019-08-12 DIAGNOSIS — Z419 Encounter for procedure for purposes other than remedying health state, unspecified: Secondary | ICD-10-CM

## 2019-08-12 DIAGNOSIS — Z881 Allergy status to other antibiotic agents status: Secondary | ICD-10-CM | POA: Diagnosis not present

## 2019-08-12 DIAGNOSIS — Z7982 Long term (current) use of aspirin: Secondary | ICD-10-CM | POA: Diagnosis not present

## 2019-08-12 DIAGNOSIS — F329 Major depressive disorder, single episode, unspecified: Secondary | ICD-10-CM | POA: Insufficient documentation

## 2019-08-12 DIAGNOSIS — Z9049 Acquired absence of other specified parts of digestive tract: Secondary | ICD-10-CM | POA: Insufficient documentation

## 2019-08-12 HISTORY — PX: KYPHOPLASTY: SHX5884

## 2019-08-12 LAB — URINALYSIS, ROUTINE W REFLEX MICROSCOPIC
Bilirubin Urine: NEGATIVE
Glucose, UA: NEGATIVE mg/dL
Hgb urine dipstick: NEGATIVE
Ketones, ur: NEGATIVE mg/dL
Nitrite: NEGATIVE
Protein, ur: NEGATIVE mg/dL
Specific Gravity, Urine: 1.024 (ref 1.005–1.030)
pH: 5 (ref 5.0–8.0)

## 2019-08-12 SURGERY — KYPHOPLASTY
Anesthesia: General | Site: Spine Thoracic

## 2019-08-12 MED ORDER — FENTANYL CITRATE (PF) 100 MCG/2ML IJ SOLN: 25.0000 ug | INTRAMUSCULAR | Status: DC | PRN

## 2019-08-12 MED ORDER — PROPOFOL 10 MG/ML IV BOLUS
INTRAVENOUS | Status: AC
  Filled 2019-08-12: qty 20

## 2019-08-12 MED ORDER — PHENYLEPHRINE 40 MCG/ML (10ML) SYRINGE FOR IV PUSH (FOR BLOOD PRESSURE SUPPORT)
PREFILLED_SYRINGE | INTRAVENOUS | Status: DC | PRN
  Administered 2019-08-12: 120 ug via INTRAVENOUS
  Administered 2019-08-12: 80 ug via INTRAVENOUS
  Administered 2019-08-12: 120 ug via INTRAVENOUS

## 2019-08-12 MED ORDER — BUPIVACAINE HCL (PF) 0.25 % IJ SOLN
INTRAMUSCULAR | Status: DC | PRN
  Administered 2019-08-12: 30 mL

## 2019-08-12 MED ORDER — MIDAZOLAM HCL 2 MG/2ML IJ SOLN
INTRAMUSCULAR | Status: AC
  Filled 2019-08-12: qty 2

## 2019-08-12 MED ORDER — CHLORHEXIDINE GLUCONATE 0.12 % MT SOLN: 15.0000 mL | Freq: Once | OROMUCOSAL | Status: AC

## 2019-08-12 MED ORDER — ROCURONIUM BROMIDE 10 MG/ML (PF) SYRINGE
PREFILLED_SYRINGE | INTRAVENOUS | Status: AC
  Filled 2019-08-12: qty 10

## 2019-08-12 MED ORDER — EPINEPHRINE PF 1 MG/ML IJ SOLN
INTRAMUSCULAR | Status: DC | PRN
  Administered 2019-08-12: .15 mL

## 2019-08-12 MED ORDER — BACITRACIN ZINC 500 UNIT/GM EX OINT
TOPICAL_OINTMENT | CUTANEOUS | Status: AC
  Filled 2019-08-12: qty 28.35

## 2019-08-12 MED ORDER — ORAL CARE MOUTH RINSE: 15.0000 mL | Freq: Once | OROMUCOSAL | Status: AC

## 2019-08-12 MED ORDER — FENTANYL CITRATE (PF) 250 MCG/5ML IJ SOLN
INTRAMUSCULAR | Status: AC
  Filled 2019-08-12: qty 5

## 2019-08-12 MED ORDER — POVIDONE-IODINE 7.5 % EX SOLN
Freq: Once | CUTANEOUS | Status: DC
  Filled 2019-08-12: qty 118

## 2019-08-12 MED ORDER — OXYCODONE-ACETAMINOPHEN 5-325 MG PO TABS: 1.0000 | ORAL_TABLET | ORAL | 0 refills | Status: AC | PRN

## 2019-08-12 MED ORDER — MIDAZOLAM HCL 5 MG/5ML IJ SOLN
INTRAMUSCULAR | Status: DC | PRN
  Administered 2019-08-12: 2 mg via INTRAVENOUS

## 2019-08-12 MED ORDER — CHLORHEXIDINE GLUCONATE 0.12 % MT SOLN
OROMUCOSAL | Status: AC
  Administered 2019-08-12: 15 mL via OROMUCOSAL
  Filled 2019-08-12: qty 15

## 2019-08-12 MED ORDER — LACTATED RINGERS IV SOLN: INTRAVENOUS | Status: DC

## 2019-08-12 MED ORDER — ACETAMINOPHEN 500 MG PO TABS: 1000.0000 mg | ORAL_TABLET | Freq: Once | ORAL | Status: DC

## 2019-08-12 MED ORDER — SODIUM CHLORIDE (PF) 0.9 % IJ SOLN
INTRAMUSCULAR | Status: AC
  Filled 2019-08-12: qty 50

## 2019-08-12 MED ORDER — VANCOMYCIN HCL IN DEXTROSE 1-5 GM/200ML-% IV SOLN
INTRAVENOUS | Status: AC
  Filled 2019-08-12: qty 200

## 2019-08-12 MED ORDER — PHENYLEPHRINE HCL-NACL 10-0.9 MG/250ML-% IV SOLN
INTRAVENOUS | Status: DC | PRN
Start: 2019-08-12 — End: 2019-08-12
  Administered 2019-08-12: 25 ug/min via INTRAVENOUS

## 2019-08-12 MED ORDER — LIDOCAINE 2% (20 MG/ML) 5 ML SYRINGE
INTRAMUSCULAR | Status: DC | PRN
  Administered 2019-08-12: 100 mg via INTRAVENOUS

## 2019-08-12 MED ORDER — DEXAMETHASONE SODIUM PHOSPHATE 10 MG/ML IJ SOLN
INTRAMUSCULAR | Status: DC | PRN
  Administered 2019-08-12: 10 mg via INTRAVENOUS

## 2019-08-12 MED ORDER — BUPIVACAINE HCL (PF) 0.25 % IJ SOLN
INTRAMUSCULAR | Status: AC
  Filled 2019-08-12: qty 30

## 2019-08-12 MED ORDER — LIDOCAINE 2% (20 MG/ML) 5 ML SYRINGE
INTRAMUSCULAR | Status: AC
  Filled 2019-08-12: qty 5

## 2019-08-12 MED ORDER — CLINDAMYCIN PHOSPHATE 900 MG/50ML IV SOLN
900.0000 mg | Freq: Once | INTRAVENOUS | Status: AC
  Administered 2019-08-12: 900 mg via INTRAVENOUS

## 2019-08-12 MED ORDER — IOPAMIDOL (ISOVUE-300) INJECTION 61%
INTRAVENOUS | Status: DC | PRN
  Administered 2019-08-12 (×2): 50 mL

## 2019-08-12 MED ORDER — 0.9 % SODIUM CHLORIDE (POUR BTL) OPTIME
TOPICAL | Status: DC | PRN
  Administered 2019-08-12: 1000 mL

## 2019-08-12 MED ORDER — ONDANSETRON HCL 4 MG/2ML IJ SOLN
INTRAMUSCULAR | Status: AC
  Filled 2019-08-12: qty 2

## 2019-08-12 MED ORDER — CLINDAMYCIN PHOSPHATE 900 MG/50ML IV SOLN
INTRAVENOUS | Status: AC
  Filled 2019-08-12: qty 50

## 2019-08-12 MED ORDER — ACETAMINOPHEN 500 MG PO TABS
ORAL_TABLET | ORAL | Status: AC
  Filled 2019-08-12: qty 2

## 2019-08-12 MED ORDER — PROPOFOL 10 MG/ML IV BOLUS
INTRAVENOUS | Status: DC | PRN
  Administered 2019-08-12: 110 mg via INTRAVENOUS

## 2019-08-12 MED ORDER — FENTANYL CITRATE (PF) 100 MCG/2ML IJ SOLN
INTRAMUSCULAR | Status: DC | PRN
  Administered 2019-08-12: 100 ug via INTRAVENOUS

## 2019-08-12 MED ORDER — BACITRACIN ZINC 500 UNIT/GM EX OINT
TOPICAL_OINTMENT | CUTANEOUS | Status: DC | PRN
  Administered 2019-08-12: 1 via TOPICAL

## 2019-08-12 MED ORDER — EPINEPHRINE PF 1 MG/ML IJ SOLN
INTRAMUSCULAR | Status: AC
  Filled 2019-08-12: qty 1

## 2019-08-12 MED ORDER — ONDANSETRON HCL 4 MG/2ML IJ SOLN
INTRAMUSCULAR | Status: DC | PRN
  Administered 2019-08-12: 4 mg via INTRAVENOUS

## 2019-08-12 MED ORDER — DEXAMETHASONE SODIUM PHOSPHATE 10 MG/ML IJ SOLN
INTRAMUSCULAR | Status: AC
  Filled 2019-08-12: qty 1

## 2019-08-12 MED ORDER — ROCURONIUM BROMIDE 10 MG/ML (PF) SYRINGE
PREFILLED_SYRINGE | INTRAVENOUS | Status: DC | PRN
  Administered 2019-08-12: 40 mg via INTRAVENOUS

## 2019-08-12 MED ORDER — VANCOMYCIN HCL IN DEXTROSE 1-5 GM/200ML-% IV SOLN: 1000.0000 mg | INTRAVENOUS | Status: DC

## 2019-08-12 MED ORDER — SUGAMMADEX SODIUM 200 MG/2ML IV SOLN
INTRAVENOUS | Status: DC | PRN
  Administered 2019-08-12: 150 mg via INTRAVENOUS

## 2019-08-12 SURGICAL SUPPLY — 54 items
BLADE SURG 15 STRL LF DISP TIS (BLADE) ×1 IMPLANT
BLADE SURG 15 STRL SS (BLADE) ×2
CEMENT BONE KYPHX HV R (Orthopedic Implant) ×2 IMPLANT
COVER MAYO STAND STRL (DRAPES) ×2 IMPLANT
COVER SURGICAL LIGHT HANDLE (MISCELLANEOUS) ×2 IMPLANT
COVER WAND RF STERILE (DRAPES) ×2 IMPLANT
CURETTE EXPRESS SZ2 7MM (INSTRUMENTS) IMPLANT
CURRETTE EXPRESS SZ2 7MM (INSTRUMENTS)
DRAPE C-ARM 42X72 X-RAY (DRAPES) ×2 IMPLANT
DRAPE HALF SHEET 40X57 (DRAPES) IMPLANT
DRAPE INCISE IOBAN 66X45 STRL (DRAPES) ×2 IMPLANT
DRAPE LAPAROTOMY T 102X78X121 (DRAPES) ×2 IMPLANT
DRAPE SURG 17X23 STRL (DRAPES) ×8 IMPLANT
DRAPE WARM FLUID 44X44 (DRAPES) ×2 IMPLANT
DRSG COVADERM PLUS 2X2 (GAUZE/BANDAGES/DRESSINGS) ×6 IMPLANT
DRSG TEGADERM 2-3/8X2-3/4 SM (GAUZE/BANDAGES/DRESSINGS) ×6 IMPLANT
DRSG TEGADERM 4X4.75 (GAUZE/BANDAGES/DRESSINGS) ×2 IMPLANT
DURAPREP 26ML APPLICATOR (WOUND CARE) ×2 IMPLANT
GAUZE 4X4 16PLY RFD (DISPOSABLE) ×2 IMPLANT
GAUZE SPONGE 2X2 8PLY STRL LF (GAUZE/BANDAGES/DRESSINGS) ×1 IMPLANT
GLOVE BIO SURGEON STRL SZ7 (GLOVE) ×2 IMPLANT
GLOVE BIO SURGEON STRL SZ8 (GLOVE) ×2 IMPLANT
GLOVE BIOGEL PI IND STRL 7.0 (GLOVE) ×1 IMPLANT
GLOVE BIOGEL PI IND STRL 8 (GLOVE) ×1 IMPLANT
GLOVE BIOGEL PI INDICATOR 7.0 (GLOVE) ×1
GLOVE BIOGEL PI INDICATOR 8 (GLOVE) ×1
GOWN STRL REUS W/ TWL LRG LVL3 (GOWN DISPOSABLE) ×2 IMPLANT
GOWN STRL REUS W/ TWL XL LVL3 (GOWN DISPOSABLE) ×1 IMPLANT
GOWN STRL REUS W/TWL LRG LVL3 (GOWN DISPOSABLE) ×4
GOWN STRL REUS W/TWL XL LVL3 (GOWN DISPOSABLE) ×2
KIT BASIN OR (CUSTOM PROCEDURE TRAY) ×2 IMPLANT
KIT TURNOVER KIT B (KITS) ×2 IMPLANT
NEEDLE FILTER BLUNT 18X 1/2SAF (NEEDLE) ×1
NEEDLE FILTER BLUNT 18X1 1/2 (NEEDLE) ×1 IMPLANT
NEEDLE HYPO 25GX1X1/2 BEV (NEEDLE) ×2 IMPLANT
NEEDLE HYPO 25X1 1.5 SAFETY (NEEDLE) ×2 IMPLANT
NEEDLE SPNL 18GX3.5 QUINCKE PK (NEEDLE) ×4 IMPLANT
NS IRRIG 1000ML POUR BTL (IV SOLUTION) ×2 IMPLANT
PACK UNIVERSAL I (CUSTOM PROCEDURE TRAY) ×2 IMPLANT
PAD ARMBOARD 7.5X6 YLW CONV (MISCELLANEOUS) ×4 IMPLANT
POSITIONER HEAD PRONE TRACH (MISCELLANEOUS) ×2 IMPLANT
SPONGE GAUZE 2X2 STER 10/PKG (GAUZE/BANDAGES/DRESSINGS) ×1
SUT MNCRL AB 4-0 PS2 18 (SUTURE) ×2 IMPLANT
SYR 30ML BONE CEMENT XPNDR (MISCELLANEOUS) ×4
SYR BULB IRRIG 60ML STRL (SYRINGE) ×2 IMPLANT
SYR CONTROL 10ML LL (SYRINGE) ×2 IMPLANT
SYR TB 1ML LUER SLIP (SYRINGE) ×2 IMPLANT
SYRINGE 30ML BONE CEMENT XPNDR (MISCELLANEOUS) ×2 IMPLANT
TAPE CLOTH 4X10 WHT NS (GAUZE/BANDAGES/DRESSINGS) ×2 IMPLANT
TOWEL GREEN STERILE (TOWEL DISPOSABLE) ×2 IMPLANT
TOWEL GREEN STERILE FF (TOWEL DISPOSABLE) ×2 IMPLANT
TRAY KYPHOPAK 15/3 ADV 1ST (SET/KITS/TRAYS/PACK) ×4 IMPLANT
TRAY KYPHOPAK 15/3 ONESTEP 1ST (MISCELLANEOUS) ×2 IMPLANT
TRAY KYPHOPAK 20/3 ONESTEP 1ST (MISCELLANEOUS) IMPLANT

## 2019-08-12 NOTE — H&P (Signed)
PREOPERATIVE H&P  Chief Complaint: Mid to low back pain  HPI: Diamond Mills is a 64 y.o. female who presents with ongoing pain in the back  MRI reveals active compression fractures at T12 and L4  Patient has failed multiple forms of conservative care and continues to have pain (see office notes for additional details regarding the patient's full course of treatment)  Past Medical History:  Diagnosis Date  . Amaurosis fugax    Post cardiac catheterization 2002; normal carotid ultrasound  . Anxiety and depression   . Basal cell carcinoma 2000   Right face  . Cerebrovascular accident ([place])   . Coronary atherosclerosis of native coronary artery    Nonobstructive in 2002 (50-60% D1, anomalous RCA from [facility] with 30% proximal stenosis)  . Degenerative joint disease    Fingers  . Diverticulosis    On screening colonoscopy  . Essential hypertension, benign   . Fatty liver   . Gastroesophageal reflux disease    Peptic ulcer disease; Hiatal hernia, EGD normal in 2000; colonoscopy - internal hemorrhoids and 2000  . Hypothyroidism   . Irritable bowel syndrome   . Migraine headache    Occasional  . Palpitations    Past Surgical History:  Procedure Laterality Date  . BLADDER SURGERY  2016  . CARDIAC CATHETERIZATION  2002   Multiple procedures prior to 2002; nonobstructive disease  . CHOLECYSTECTOMY    . CHOLECYSTECTOMY, LAPAROSCOPIC    . COLONOSCOPY  12/11/10   Diverticulosis, random bx negative for microscopic colitis  . ESOPHAGOGASTRODUODENOSCOPY  12/11/10   Small hh, tiny antral erosions, SB bx negative for celiac  . SHOULDER SURGERY     Spur  . TUBAL LIGATION     Bilateral  . TUBAL LIGATION     Social History   Socioeconomic History  . Marital status: Married    Spouse name: Not on file  . Number of children: 2  . Years of education: Not on file  . Highest education level: Not on file  Occupational History  . Occupation: CNA  Tobacco Use  . Smoking status:  Former Smoker    Packs/day: 2.00    Years: 15.00    Pack years: 30.00    Types: Cigarettes    Quit date: 07/13/1981    Years since quitting: 38.1  . Smokeless tobacco: Never Used  Vaping Use  . Vaping Use: Never used  Substance and Sexual Activity  . Alcohol use: No  . Drug use: No  . Sexual activity: Yes    Birth control/protection: Post-menopausal  Other Topics Concern  . Not on file  Social History Narrative  . Not on file   Social Determinants of Health   Financial Resource Strain:   . Difficulty of Paying Living Expenses:   Food Insecurity:   . Worried About Charity fundraiser in the Last Year:   . Arboriculturist in the Last Year:   Transportation Needs:   . Film/video editor (Medical):   Marland Kitchen Lack of Transportation (Non-Medical):   Physical Activity:   . Days of Exercise per Week:   . Minutes of Exercise per Session:   Stress:   . Feeling of Stress :   Social Connections:   . Frequency of Communication with Friends and Family:   . Frequency of Social Gatherings with Friends and Family:   . Attends Religious Services:   . Active Member of Clubs or Organizations:   . Attends Archivist  Meetings:   Marland Kitchen Marital Status:    Family History  Adopted: Yes  Problem Relation Age of Onset  . Hypertension Sister   . Colon cancer Neg Hx    Allergies  Allergen Reactions  . Contrast Media [Iodinated Diagnostic Agents] Other (See Comments)    Low Blood pressure  . Erythromycin Hives  . Penicillins Hives  . Sulfonamide Derivatives Other (See Comments)    High fever  . Vancomycin Palpitations   Prior to Admission medications   Medication Sig Start Date End Date Taking? Authorizing Provider  amLODipine (NORVASC) 5 MG tablet Take 5 mg by mouth daily.     Yes [provider]  aspirin 81 MG tablet Take 81 mg by mouth daily.     Yes [provider]  Calcium Carbonate-Vitamin D (CALCIUM 600/VITAMIN D PO) Take 1 tablet by mouth daily.   Yes  [provider]  Coenzyme Q10 (CO Q-10) 200 MG CAPS Take 200 mg by mouth daily.   Yes [provider]  ELDERBERRY PO Take 2 tablets by mouth at bedtime.   Yes [provider]  levothyroxine (SYNTHROID) 100 MCG tablet Take 100 mcg by mouth daily before breakfast.   Yes [provider]  lisinopril (PRINIVIL,ZESTRIL) 20 MG tablet Take 20 mg by mouth daily.     Yes [provider]  lovastatin (MEVACOR) 10 MG tablet TAKE ONE TABLET BY MOUTH AT BEDTIME Patient taking differently: Take 10 mg by mouth at bedtime.  12/13/14  Yes Satira Sark, MD  methocarbamol (ROBAXIN) 500 MG tablet Take 500 mg by mouth every 6 (six) hours as needed for muscle spasms. 07/06/19  Yes [provider]  Multiple Vitamin (MULTIVITAMIN) tablet Take 1 tablet by mouth daily.    Yes [provider]  omeprazole (PRILOSEC) 20 MG capsule Take 20 mg by mouth daily.     Yes [provider]  traMADol (ULTRAM) 50 MG tablet Take 1 tablet (50 mg total) by mouth every 6 (six) hours as needed. Patient not taking: Reported on 07/31/2019 06/28/19   Fredia Sorrow, MD     All other systems have been reviewed and were otherwise negative with the exception of those mentioned in the HPI and as above.  Physical Exam: Vitals:   08/12/19 0816  BP: (!) 172/73  Pulse: 83  Resp: 18  Temp: 98.9 F (37.2 C)  SpO2: 100%    Body mass index is 28.18 kg/m.  General: Alert, no acute distress Cardiovascular: No pedal edema Respiratory: No cyanosis, no use of accessory musculature Skin: No lesions in the area of chief complaint Neurologic: Sensation intact distally Psychiatric: Patient is competent for consent with normal mood and affect Lymphatic: No axillary or cervical lymphadenopathy   Assessment/Plan: THORACIC 12 AND LUMBAR 4 COMPRESSION FRACTURES  Plan for Procedure(s): THORACIC 12 AND LUMBAR 4 KYPHOPLASTY   Norva Karvonen, MD 08/12/2019 1:07 PM

## 2019-08-12 NOTE — Op Note (Signed)
PATIENT NAME: Diamond Mills   MEDICAL RECORD NO.:   [id number]    DATE OF BIRTH: 05-Aug-1955   DATE OF PROCEDURE: 08/12/2019                              OPERATIVE REPORT   PREOPERATIVE DIAGNOSIS:  T12 and L4 compression fractures.  POSTOPERATIVE DIAGNOSIS:  T12 and L4 compression fractures.  PROCEDURE:  T12 and L4 kyphoplasties  SURGEON:  Diamond Bob, MD.  ASSISTANTPricilla Holm, PA-C.  ANESTHESIA:  General endotracheal anesthesia.  COMPLICATIONS:  None.  DISPOSITION:  Stable.  ESTIMATED BLOOD LOSS:  Minimal.  INDICATIONS FOR SURGERY:  Briefly, Diamond Mills is a very pleasant 64- year-old patient, who did have an onset of pain in her low back, beginning about 6 weeks ago. Her pain was rather severe.  The patient's imaging studies did clearly reveal T12 and L4 compression fractures. Given her ongoing pain and dysfunction, we did discuss proceeding with the procedure noted above.  I did fully discuss the procedure with the patient, and she did wish to proceed.   Of note, preoperatively, we did discuss the fact that the patient does have a history of having a reaction to IV dye.  I did let her know that there is certainly a chance that she can be exposed to IV dye, although generally, the dye that is used for this procedure is contained within the kyphoplasty balloons.  I did let her know that if the balloon were to rupture, we would provide any treatment that was needed, and that we would observe her overnight if this were to occur, as she does state that her previous reaction was delayed.  After explaining the risks of surgery, including this possibility, the patient did wish to proceed.  OPERATIVE DETAILS:  On 6/30/201, the patient was brought to surgery and general endotracheal anesthesia was administered.  The patient was placed prone on a well-padded flat Jackson bed with gel rolls placed under the patient's chest and hips.  Antibiotics were given.  AP and lateral  fluoroscopy was brought into the field.  The T12 and L4 pedicles were marked out in the usual fashion.  After a time-out procedure was performed, I did advance Jamshidis across the T12 and L4 pedicles on the right and left sides. Of note, advancing the L4 Jamshidis were rather difficult due to very prominent facet hypertrophy. I was able to annulate all 4 pedicles however.  Due to the facet hypertrophy however, I was not able to angle the Jamshidi's inferiorly at L4, as the facet hypertrophy did substantially limit my ability to inferiorly ambulate. I then drilled through the Jamshidis.  I then inserted kyphoplasty balloons and I was able to inflate the balloons with approximately 5cc of contrast at each level.  The balloons were then deflated and removed.  Of note, there was no rupture of the balloons or extravasation of IV dye.  At this point, after the cement was mixed, a total of approximately 6cc of cement was injected into each vertebral body, half on the right, and half on the left.  Excellent partial interdigitation of cement was identified.  At L4, on the patient's left, the cement was noted to very slightly extravasate into what appeared to be a segmental blood vessel.  I immediately discontinued introduction of any additional cement. Given the prominent facet hypertrophy, there was good flow of cement primarily at the superior aspect of the  L4 vertebral body.  I did not feel there was any concerning extravasation of cement toward any neurologic structures. The cement was then allowed to harden, after which point the Jamshidis were removed.  I was pleased with the final appearance of T12 and L4.  The wound was then irrigated and closed using 4-0 Monocryl.  Bacitracin and a sterile dressing were applied.  The patient was then awoken from general endotracheal anesthesia and transferred to Recovery in stable condition.   Diamond Bob, MD

## 2019-08-12 NOTE — Transfer of Care (Signed)
Immediate Anesthesia Transfer of Care Note  Patient: THEODORE RAHRIG  Procedure(s) Performed: THORACIC 12 AND LUMBAR 4 KYPHOPLASTY (N/A Spine Thoracic)  Patient Location: PACU  Anesthesia Type:General  Level of Consciousness: awake, oriented and patient cooperative  Airway & Oxygen Therapy: Patient Spontanous Breathing and Patient connected to nasal cannula oxygen  Post-op Assessment: Report given to RN and Post -op Vital signs reviewed and stable  Post vital signs: Reviewed  Last Vitals:  Vitals Value Taken Time  BP 161/74 08/12/19 1630  Temp 36.1 C 08/12/19 1630  Pulse 80 08/12/19 1639  Resp 27 08/12/19 1639  SpO2 98 % 08/12/19 1639  Vitals shown include unvalidated device data.  Last Pain:  Vitals:   08/12/19 1630  TempSrc:   PainSc: 0-No pain      Patients Stated Pain Goal: 2 (38/46/65 [id number])  Complications: No complications documented.

## 2019-08-12 NOTE — Anesthesia Procedure Notes (Signed)
Procedure Name: Intubation Date/Time: 08/12/2019 2:33 PM Performed by: Jenne Campus, CRNA Pre-anesthesia Checklist: Patient identified, Emergency Drugs available, Suction available and Patient being monitored Patient Re-evaluated:Patient Re-evaluated prior to induction Oxygen Delivery Method: Circle System Utilized Preoxygenation: Pre-oxygenation with 100% oxygen Induction Type: IV induction Ventilation: Mask ventilation without difficulty Laryngoscope Size: Miller and 2 Grade View: Grade I Tube type: Oral Tube size: 7.0 mm Number of attempts: 1 Airway Equipment and Method: Stylet and Oral airway Placement Confirmation: ETT inserted through vocal cords under direct vision,  positive ETCO2 and breath sounds checked- equal and bilateral Secured at: 20 cm Tube secured with: Tape Dental Injury: Teeth and Oropharynx as per pre-operative assessment

## 2019-08-12 NOTE — OR Nursing (Signed)
Prior to procedure, the patient's allergy to iodinated contrast media was discussed with anesthesiologist, surgeon, CRNA, circulating nurse and PA.  All were made aware of patient's concerns regarding an allergic reaction to contrast media during a prior procedure.   Due to the nature of the contrast media use in this procedure, the patient was informed that the media is inserted into a bulb that should not be released into the body.  The patient was also informed that should that bulb burst for any reason and allow the contrast to be released into the body, that the CRNA would be informed immediately, protocols for anaphylaxis would begin and the patient would be admitted for an overnight stay to monitor the patient's condition.  The patient agreed to all of the above and consented to the procedure.

## 2019-08-13 NOTE — Anesthesia Postprocedure Evaluation (Signed)
Anesthesia Post Note  Patient: Diamond Mills  Procedure(s) Performed: THORACIC 12 AND LUMBAR 4 KYPHOPLASTY (N/A Spine Thoracic)     Patient location during evaluation: PACU Anesthesia Type: General Level of consciousness: sedated and patient cooperative Pain management: pain level controlled Vital Signs Assessment: post-procedure vital signs reviewed and stable Respiratory status: spontaneous breathing Cardiovascular status: stable Anesthetic complications: no   No complications documented.  Last Vitals:  Vitals:   08/12/19 1645 08/12/19 1700  BP: (!) 159/71 (!) 155/80  Pulse: 81 80  Resp: (!) 22 (!) 25  Temp:  (!) 36.3 C  SpO2: 98% 98%    Last Pain:  Vitals:   08/12/19 1700  TempSrc:   PainSc: 0-No pain                 Nolon Nations

## 2019-08-14 ENCOUNTER — Encounter (HOSPITAL_COMMUNITY): Payer: Self-pay | Admitting: Orthopedic Surgery

## 2019-08-26 ENCOUNTER — Other Ambulatory Visit (HOSPITAL_COMMUNITY): Payer: Self-pay | Admitting: Orthopedic Surgery

## 2019-08-26 ENCOUNTER — Other Ambulatory Visit: Payer: Self-pay

## 2019-08-26 ENCOUNTER — Ambulatory Visit (HOSPITAL_COMMUNITY)
Admission: RE | Admit: 2019-08-26 | Discharge: 2019-08-26 | Disposition: A | Discharge status: Home / Self Care | Payer: Medicare Other | Source: Ambulatory Visit | Attending: Surgery | Admitting: Surgery

## 2019-08-26 DIAGNOSIS — R52 Pain, unspecified: Secondary | ICD-10-CM | POA: Insufficient documentation

## 2019-10-05 ENCOUNTER — Ambulatory Visit (INDEPENDENT_AMBULATORY_CARE_PROVIDER_SITE_OTHER): Payer: PRIVATE HEALTH INSURANCE | Admitting: Gastroenterology

## 2019-11-13 ENCOUNTER — Other Ambulatory Visit (HOSPITAL_COMMUNITY): Payer: Self-pay | Admitting: Family Medicine

## 2019-11-13 DIAGNOSIS — Z1231 Encounter for screening mammogram for malignant neoplasm of breast: Secondary | ICD-10-CM

## 2020-01-06 ENCOUNTER — Encounter (INDEPENDENT_AMBULATORY_CARE_PROVIDER_SITE_OTHER): Payer: Self-pay | Admitting: Gastroenterology

## 2020-04-07 ENCOUNTER — Other Ambulatory Visit: Payer: Self-pay

## 2020-04-07 ENCOUNTER — Ambulatory Visit (INDEPENDENT_AMBULATORY_CARE_PROVIDER_SITE_OTHER): Payer: Medicare Other | Admitting: Gastroenterology

## 2020-04-07 ENCOUNTER — Other Ambulatory Visit (INDEPENDENT_AMBULATORY_CARE_PROVIDER_SITE_OTHER): Payer: Self-pay

## 2020-04-07 ENCOUNTER — Telehealth (INDEPENDENT_AMBULATORY_CARE_PROVIDER_SITE_OTHER): Payer: Self-pay

## 2020-04-07 ENCOUNTER — Encounter (INDEPENDENT_AMBULATORY_CARE_PROVIDER_SITE_OTHER): Payer: Self-pay | Admitting: Gastroenterology

## 2020-04-07 ENCOUNTER — Encounter (INDEPENDENT_AMBULATORY_CARE_PROVIDER_SITE_OTHER): Payer: Self-pay

## 2020-04-07 DIAGNOSIS — K625 Hemorrhage of anus and rectum: Secondary | ICD-10-CM

## 2020-04-07 DIAGNOSIS — Z1211 Encounter for screening for malignant neoplasm of colon: Secondary | ICD-10-CM

## 2020-04-07 MED ORDER — PEG 3350-KCL-NA BICARB-NACL 420 G PO SOLR: 4000.0000 mL | ORAL | 0 refills | Status: DC

## 2020-04-07 NOTE — Patient Instructions (Addendum)
Schedule colonoscopy

## 2020-04-07 NOTE — Progress Notes (Signed)
Maylon Peppers, M.D. Gastroenterology & Hepatology [facility] [address] [place], [place] [address] Primary Care Physician: Redmond School, MD [address] [place] [address]  Referring MD: PCP  Chief Complaint: Rectal bleeding  History of Present Illness: VEORA FONTE is a 65 y.o. female past medical history fatty liver, anxiety, depression, CVA, amaurosis fugax, coronary artery disease status post stent placement, diverticulosis, hypertension, GERD, hypothyroidism, IBS-D, post-op DVT previously on Xarelto, who presents for evaluation of rectal bleeding.  Patient reports that one month and a half she had an isolated episode of rectal bleeding when she was wiping and she saw a few specks of blood in her commode. Has never had any episodes of bleeding in the past. The patient denies having any nausea, vomiting, fever, chills, melena, hematemesis, abdominal distention, abdominal pain, diarrhea, jaundice, pruritus or weight loss.  In fact, the patient states that she was on a keto diet and lost weight up to 147 in the past, but after she had a fracture she started gaining back her weight.  She reports that she takes dicyclomine as needed for episodes of diarrhea or abdominal pain.  Upon review of her medical chart, the patient has not had any elevation of her aminotransferases at least since 2012.  Her most recent labs were from 08/10/2019 with an AST of 23 and ALT of 19.  Last EGD: 12/11/2010, normal esophagus, small hiatal hernia, scant tiny antral erosions.  Biopsies of the small bowel were performed, negative for any alterations. Last Colonoscopy: 12/11/2010, left-sided diverticulosis, normal terminal ileum.  Random colonic biopsies were taken which were negative for microscopic colitis  FHx: neg for any gastrointestinal/liver disease, no malignancies Social: former smoking quit 30 years ago, neg alcohol or illicit drug  use Surgical: tube ligation, cholecystectomy  Past Medical History: Past Medical History:  Diagnosis Date  . Amaurosis fugax    Post cardiac catheterization 2002; normal carotid ultrasound  . Anxiety and depression   . Basal cell carcinoma 2000   Right face  . Cerebrovascular accident ([place])   . Coronary atherosclerosis of native coronary artery    Nonobstructive in 2002 (50-60% D1, anomalous RCA from [facility] with 30% proximal stenosis)  . Degenerative joint disease    Fingers  . Diverticulosis    On screening colonoscopy  . Essential hypertension, benign   . Fatty liver   . Gastroesophageal reflux disease    Peptic ulcer disease; Hiatal hernia, EGD normal in 2000; colonoscopy - internal hemorrhoids and 2000  . Hypothyroidism   . Irritable bowel syndrome   . Migraine headache    Occasional  . Palpitations     Past Surgical History: Past Surgical History:  Procedure Laterality Date  . BLADDER SURGERY  2016  . CARDIAC CATHETERIZATION  2002   Multiple procedures prior to 2002; nonobstructive disease  . CHOLECYSTECTOMY    . CHOLECYSTECTOMY, LAPAROSCOPIC    . COLONOSCOPY  12/11/10   Diverticulosis, random bx negative for microscopic colitis  . ESOPHAGOGASTRODUODENOSCOPY  12/11/10   Small hh, tiny antral erosions, SB bx negative for celiac  . KYPHOPLASTY N/A 08/12/2019   Procedure: THORACIC 12 AND LUMBAR 4 KYPHOPLASTY;  Surgeon: Phylliss Bob, MD;  Location: [place];  Service: Orthopedics;  Laterality: N/A;  . SHOULDER SURGERY     Spur  . TUBAL LIGATION     Bilateral  . TUBAL LIGATION      Family History: Family History  Adopted: Yes  Problem Relation Age of Onset  .  Hypertension Sister   . Colon cancer Neg Hx     Social History: Social History   Tobacco Use  Smoking Status Former Smoker  . Packs/day: 2.00  . Years: 15.00  . Pack years: 30.00  . Types: Cigarettes  . Quit date: 07/13/1981  . Years since quitting: 38.7  Smokeless Tobacco Never Used   Social  History   Substance and Sexual Activity  Alcohol Use No   Social History   Substance and Sexual Activity  Drug Use No    Allergies: Allergies  Allergen Reactions  . Contrast Media [Iodinated Diagnostic Agents] Other (See Comments)    Low Blood pressure  . Erythromycin Hives  . Penicillins Hives  . Sulfonamide Derivatives Other (See Comments)    High fever  . Vancomycin Palpitations    Medications: Current Outpatient Medications  Medication Sig Dispense Refill  . amLODipine (NORVASC) 5 MG tablet Take 5 mg by mouth daily.    Marland Kitchen aspirin EC 81 MG tablet Take 81 mg by mouth daily. Swallow whole.    . Calcium Carbonate-Vitamin D (CALCIUM 600/VITAMIN D PO) Take 1 tablet by mouth daily.    . Coenzyme Q10 (CO Q-10) 200 MG CAPS Take 200 mg by mouth daily.    Marland Kitchen ELDERBERRY PO Take 2 tablets by mouth at bedtime.    Marland Kitchen levothyroxine (SYNTHROID) 100 MCG tablet Take 88 mcg by mouth daily before breakfast.    . lisinopril (PRINIVIL,ZESTRIL) 20 MG tablet Take 20 mg by mouth daily.    Marland Kitchen lovastatin (MEVACOR) 10 MG tablet TAKE ONE TABLET BY MOUTH AT BEDTIME (Patient taking differently: Take 10 mg by mouth at bedtime.) 30 tablet 1  . methocarbamol (ROBAXIN) 500 MG tablet Take 500 mg by mouth every 6 (six) hours as needed for muscle spasms.    . Multiple Vitamin (MULTIVITAMIN) tablet Take 1 tablet by mouth daily.     Marland Kitchen omeprazole (PRILOSEC) 20 MG capsule Take 20 mg by mouth daily.     No current facility-administered medications for this visit.    Review of Systems: GENERAL: negative for malaise, night sweats HEENT: No changes in hearing or vision, no nose bleeds or other nasal problems. NECK: Negative for lumps, goiter, pain and significant neck swelling RESPIRATORY: Negative for cough, wheezing CARDIOVASCULAR: Negative for chest pain, leg swelling, palpitations, orthopnea GI: SEE HPI MUSCULOSKELETAL: Negative for joint pain or swelling, back pain, and muscle pain. SKIN: Negative for  lesions, rash PSYCH: Negative for sleep disturbance, mood disorder and recent psychosocial stressors. HEMATOLOGY Negative for prolonged bleeding, bruising easily, and swollen nodes. ENDOCRINE: Negative for cold or heat intolerance, polyuria, polydipsia and goiter. NEURO: negative for tremor, gait imbalance, syncope and seizures. The remainder of the review of systems is noncontributory.   Physical Exam: BP 129/80 (BP Location: Left Arm, Patient Position: Sitting, Cuff Size: Large)   Pulse 67   Temp 97.6 F (36.4 C) (Oral)   Ht 5\' 2"  (1.575 m)   Wt 179 lb (81.2 kg)   LMP 12/10/2001   BMI 32.74 kg/m  GENERAL: The patient is AO x3, in no acute distress. HEENT: Head is normocephalic and atraumatic. EOMI are intact. Mouth is well hydrated and without lesions. NECK: Supple. No masses LUNGS: Clear to auscultation. No presence of rhonchi/wheezing/rales. Adequate chest expansion HEART: RRR, normal s1 and s2. ABDOMEN: mildly tender upon palpation of the RUQ, no guarding, no peritoneal signs, and nondistended. BS +. No masses. EXTREMITIES: Without any cyanosis, clubbing, rash, lesions or edema. NEUROLOGIC: AOx3, no focal  motor deficit. SKIN: no jaundice, no rashes   Imaging/Labs: as above  I personally reviewed and interpreted the available labs, imaging and endoscopic files.  Impression and Plan: TIFFNEY HAUGHTON is a 65 y.o. female past medical history fatty liver, anxiety, depression, CVA, amaurosis fugax, coronary artery disease status post stent placement, diverticulosis, hypertension, GERD, hypothyroidism, IBS-D, post-op DVT previously on Xarelto, who presents for evaluation of rectal bleeding.  The patient had an isolated episode of rectal bleeding which could be related to hemorrhoids or diverticular bleeding, less likely due to malignancy as her hemoglobin has been stable recently.  However, given the time span since her last colonoscopy, she will need to pursue a colonoscopy at this  time which she is agreeable to.  In terms of her fatty liver, she has not had any elevation of her liver enzymes which is inconsistent with NASH.  Patient was encouraged to keep losing weight as this could have benefits for other comorbidities.  - Schedule colonoscopy  All questions were answered.      Maylon Peppers, MD Gastroenterology and Hepatology [facility]

## 2020-04-12 NOTE — Telephone Encounter (Signed)
LeighAnn Analiza Cowger, CMA  

## 2020-04-15 ENCOUNTER — Encounter (INDEPENDENT_AMBULATORY_CARE_PROVIDER_SITE_OTHER): Payer: Self-pay

## 2020-04-18 ENCOUNTER — Other Ambulatory Visit (HOSPITAL_COMMUNITY): Payer: PRIVATE HEALTH INSURANCE

## 2020-04-19 ENCOUNTER — Encounter (HOSPITAL_COMMUNITY): Payer: Self-pay

## 2020-04-19 ENCOUNTER — Ambulatory Visit (HOSPITAL_COMMUNITY): Admit: 2020-04-19 | Payer: PRIVATE HEALTH INSURANCE | Admitting: Gastroenterology

## 2020-04-19 SURGERY — COLONOSCOPY WITH PROPOFOL
Anesthesia: Monitor Anesthesia Care

## 2020-08-11 ENCOUNTER — Other Ambulatory Visit: Payer: Self-pay

## 2020-08-11 ENCOUNTER — Other Ambulatory Visit (HOSPITAL_COMMUNITY): Payer: Self-pay | Admitting: Family Medicine

## 2020-08-11 ENCOUNTER — Ambulatory Visit (HOSPITAL_COMMUNITY)
Admission: RE | Admit: 2020-08-11 | Discharge: 2020-08-11 | Disposition: A | Discharge status: Home / Self Care | Payer: Medicare Other | Source: Ambulatory Visit | Attending: Family Medicine | Admitting: Family Medicine

## 2020-08-11 DIAGNOSIS — M545 Low back pain, unspecified: Secondary | ICD-10-CM

## 2020-12-02 ENCOUNTER — Encounter: Payer: Self-pay | Admitting: *Deleted

## 2020-12-07 ENCOUNTER — Other Ambulatory Visit: Payer: Self-pay | Admitting: Internal Medicine

## 2020-12-07 DIAGNOSIS — Z139 Encounter for screening, unspecified: Secondary | ICD-10-CM

## 2020-12-12 ENCOUNTER — Ambulatory Visit
Admission: RE | Admit: 2020-12-12 | Discharge: 2020-12-12 | Disposition: A | Discharge status: Home / Self Care | Payer: Medicare Other | Source: Ambulatory Visit | Attending: Internal Medicine | Admitting: Internal Medicine

## 2020-12-12 ENCOUNTER — Other Ambulatory Visit: Payer: Self-pay

## 2020-12-12 DIAGNOSIS — Z139 Encounter for screening, unspecified: Secondary | ICD-10-CM

## 2020-12-14 ENCOUNTER — Other Ambulatory Visit: Payer: Self-pay | Admitting: Family Medicine

## 2020-12-14 DIAGNOSIS — R5381 Other malaise: Secondary | ICD-10-CM

## 2021-01-09 ENCOUNTER — Other Ambulatory Visit (HOSPITAL_COMMUNITY): Payer: Self-pay | Admitting: Internal Medicine

## 2021-01-09 ENCOUNTER — Ambulatory Visit (HOSPITAL_COMMUNITY)
Admission: RE | Admit: 2021-01-09 | Discharge: 2021-01-09 | Disposition: A | Discharge status: Home / Self Care | Payer: Medicare Other | Source: Ambulatory Visit | Attending: Internal Medicine | Admitting: Internal Medicine

## 2021-01-09 ENCOUNTER — Other Ambulatory Visit: Payer: Self-pay

## 2021-01-09 DIAGNOSIS — M545 Low back pain, unspecified: Secondary | ICD-10-CM

## 2021-01-16 ENCOUNTER — Telehealth: Payer: Self-pay | Admitting: *Deleted

## 2021-01-16 NOTE — Telephone Encounter (Signed)
Spoke to pt.  Informed her that we received her questionnaire.  Informed pt that we seen that she currently goes to [place] and sees Dr. Jenetta Downer.  Pt informed me that she would like to come to [place] and continue care here.  Pt informed me that she really didn't want to go back and see Dr. Jenetta Downer.  Discussed with Reba and pt ok to come back here.  Reba informed me to let pt know that eventually our practices would be together and there may be a possibility that she may end up seeing Dr. Jenetta Downer in the future.  Tried to call pt back but had to leave a voice message for a return call.

## 2021-01-17 ENCOUNTER — Other Ambulatory Visit: Payer: Self-pay | Admitting: Orthopedic Surgery

## 2021-01-17 ENCOUNTER — Encounter: Payer: Self-pay | Admitting: Internal Medicine

## 2021-01-17 DIAGNOSIS — M545 Low back pain, unspecified: Secondary | ICD-10-CM

## 2021-01-27 ENCOUNTER — Ambulatory Visit
Admission: RE | Admit: 2021-01-27 | Discharge: 2021-01-27 | Disposition: A | Discharge status: Home / Self Care | Payer: Medicare Other | Source: Ambulatory Visit | Attending: Orthopedic Surgery | Admitting: Orthopedic Surgery

## 2021-01-27 ENCOUNTER — Other Ambulatory Visit: Payer: Self-pay

## 2021-01-27 DIAGNOSIS — M545 Low back pain, unspecified: Secondary | ICD-10-CM

## 2021-02-06 ENCOUNTER — Other Ambulatory Visit: Payer: PRIVATE HEALTH INSURANCE

## 2021-03-14 ENCOUNTER — Other Ambulatory Visit (HOSPITAL_COMMUNITY): Payer: Self-pay | Admitting: Internal Medicine

## 2021-03-14 DIAGNOSIS — N281 Cyst of kidney, acquired: Secondary | ICD-10-CM

## 2021-03-17 ENCOUNTER — Encounter: Payer: Self-pay | Admitting: *Deleted

## 2021-03-20 ENCOUNTER — Other Ambulatory Visit: Payer: Self-pay

## 2021-03-20 ENCOUNTER — Ambulatory Visit (HOSPITAL_COMMUNITY)
Admission: RE | Admit: 2021-03-20 | Discharge: 2021-03-20 | Disposition: A | Discharge status: Home / Self Care | Payer: Medicare Other | Source: Ambulatory Visit | Attending: Internal Medicine | Admitting: Internal Medicine

## 2021-03-20 DIAGNOSIS — N281 Cyst of kidney, acquired: Secondary | ICD-10-CM | POA: Diagnosis present

## 2021-03-30 ENCOUNTER — Ambulatory Visit: Payer: Medicare Other | Admitting: Urology

## 2021-04-12 NOTE — Progress Notes (Signed)
Primary Care Physician:  Redmond School, MD Primary Gastroenterologist:  Dr. Gala Romney  Chief Complaint  Patient presents with   Colonoscopy    Gluten allergy    HPI:   [place] is a 66 y.o. female with history of GERD, IBS with diarrhea, fatty liver, anxiety, depression, CVA, amaurosis fugax, coronary artery disease status post stent placement, HTN, hypothyroidism, presenting today to discuss scheduling a colonoscopy.   She followed with Dr. Gala Romney in the past, has been following with Dr. Olevia Perches office more recently, but requested to return care to our office. She last saw Dr. Jenetta Downer on 04/07/20 for rectal bleeding. Reported an isolated episode of rectal bleeding on toilet tissue and in toilet water. No other significant symptoms. Taking dicyclomine as needed for episodes of diarrhea or abdominal pain. She was scheduled for a colonoscopy, but ultimately did not go through with this.   Today:  Quit eating gluten and her IBS symptoms went away completely. Bms daily to every other day. No constipation or diarrhea. No abdominal pain.   No unintentional weight loss. One episode of rectal bleeding early last year, January or February. None since. Blood was on toilet tissue and in toilet water. No abdominal pain or rectal pain.   No upper GI symptoms. GERD is well controlled on omeprazole. Has symptoms if she doesn't take her mediations. No dysphagia.   Last EGD: 12/11/2010, normal esophagus, small hiatal hernia, scant tiny antral erosions.  Biopsies of the small bowel were performed, negative for any alterations. Last Colonoscopy: 12/11/2010, left-sided diverticulosis, normal terminal ileum.  Random colonic biopsies were taken which were negative for microscopic colitis.  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[facility])    Coronary atherosclerosis of native coronary artery    Nonobstructive in 2002 (50-60% D1, anomalous RCA from [facility] with 30% proximal stenosis)   Degenerative joint disease    Fingers   Diverticulosis    On screening colonoscopy   Essential hypertension, benign    Fatty liver    Gastroesophageal reflux disease    Peptic ulcer disease; Hiatal hernia, EGD normal in 2000; colonoscopy - internal hemorrhoids and 2000   Hypothyroidism    Irritable bowel syndrome    Migraine headache    Occasional   Palpitations     Past Surgical History:  Procedure Laterality Date   BLADDER SURGERY  2016   CARDIAC CATHETERIZATION  2002   Multiple procedures prior to 2002; nonobstructive disease   CHOLECYSTECTOMY     CHOLECYSTECTOMY, LAPAROSCOPIC     COLONOSCOPY  12/11/10   Diverticulosis, random bx negative for microscopic colitis   ESOPHAGOGASTRODUODENOSCOPY  12/11/10   Small hh, tiny antral erosions, SB bx negative for celiac   KYPHOPLASTY N/A 08/12/2019   Procedure: THORACIC 12 AND LUMBAR 4 KYPHOPLASTY;  Surgeon: Phylliss Bob, MD;  Location: [place];  Service: Orthopedics;  Laterality: N/A;   SHOULDER SURGERY     Spur   TUBAL LIGATION     Bilateral   TUBAL LIGATION      Current Outpatient Medications  Medication Sig Dispense Refill   amLODipine (NORVASC) 5 MG tablet Take 5 mg by mouth daily.     aspirin EC 81 MG tablet Take 81 mg by mouth daily. Swallow whole.     Coenzyme Q10 (CO Q-10) 200 MG  CAPS Take 200 mg by mouth daily.     ELDERBERRY PO Take 2 tablets by mouth at bedtime.     levothyroxine (SYNTHROID) 100 MCG tablet Take 88 mcg by mouth daily before breakfast.     lisinopril (PRINIVIL,ZESTRIL) 20 MG tablet Take 20 mg by mouth daily.     lovastatin (MEVACOR) 10 MG tablet TAKE ONE TABLET BY MOUTH AT BEDTIME (Patient taking differently: Take 10 mg by mouth at bedtime.) 30 tablet 1   Multiple Vitamin (MULTIVITAMIN) tablet Take 1 tablet by mouth daily.      omeprazole (PRILOSEC) 20  MG capsule Take 20 mg by mouth daily.     No current facility-administered medications for this visit.    Allergies as of 04/13/2021 - Review Complete 04/13/2021  Allergen Reaction Noted   Contrast media [iodinated contrast media] Other (See Comments) 12/11/2010   Erythromycin Hives 10/29/2012   Penicillins Hives    Sulfonamide derivatives Other (See Comments)    Vancomycin Palpitations     Family History  Adopted: Yes  Problem Relation Age of Onset   Hypertension Sister    Colon cancer Neg Hx    Breast cancer Neg Hx     Social History   Socioeconomic History   Marital status: Married    Spouse name: Not on file   Number of children: 2   Years of education: Not on file   Highest education level: Not on file  Occupational History   Occupation: CNA  Tobacco Use   Smoking status: Former    Packs/day: 2.00    Years: 15.00    Pack years: 30.00    Types: Cigarettes    Quit date: 07/13/1981    Years since quitting: 39.7   Smokeless tobacco: Never  Vaping Use   Vaping Use: Never used  Substance and Sexual Activity   Alcohol use: No   Drug use: No   Sexual activity: Yes    Birth control/protection: Post-menopausal  Other Topics Concern   Not on file  Social History Narrative   Not on file   Social Determinants of Health   Financial Resource Strain: Not on file  Food Insecurity: Not on file  Transportation Needs: Not on file  Physical Activity: Not on file  Stress: Not on file  Social Connections: Not on file  Intimate Partner Violence: Not on file    Review of Systems: Gen: Denies any fever, chills, cold or flulike symptoms, presyncope, syncope. CV: Denies chest pain, heart palpitations. Resp: Denies shortness of breath or cough. GI: See HPI GU : Denies urinary burning, urinary frequency, urinary hesitancy MS: Denies joint pain.  Derm: Denies rash. Psych: Denies depression, anxiety. Heme: See HPI  Physical Exam: BP 102/64    Pulse 78    Temp 97.9 F  (36.6 C) (Temporal)    Ht 5\' 5"  (1.651 m)    Wt 179 lb (81.2 kg)    LMP 12/10/2001    BMI 29.79 kg/m  General:   Alert and oriented. Pleasant and cooperative. Well-nourished and well-developed.  Head:  Normocephalic and atraumatic. Eyes:  Without icterus, sclera clear and conjunctiva pink.  Ears:  Normal auditory acuity. Lungs:  Clear to auscultation bilaterally. No wheezes, rales, or rhonchi. No distress.  Heart:  S1, S2 present without murmurs appreciated.  Abdomen:  +BS, soft, non-tender and non-distended. No HSM noted. No guarding or rebound. No masses appreciated.  Rectal:  Deferred. Msk:  Symmetrical without gross deformities. Normal posture. Extremities:  Without edema. Neurologic:  Alert and  oriented x4;  grossly normal neurologically. Skin:  Intact without significant lesions or rashes. Psych:  Normal mood and affect.    Assessment: 66 y.o. female with history of GERD, IBS with diarrhea, fatty liver, anxiety, depression, CVA, amaurosis fugax, coronary artery disease status post stent placement, HTN, hypothyroidism, presenting today to discuss scheduling a colonoscopy.   Colon cancer screening: No history of colon polyps.  No family history of colon cancer.  Last colonoscopy in October 2012 with left-sided diverticulosis, normal TI.  Random colon biopsies negative for microscopic colitis.  She is doing well without any significant GI symptoms.  She had a single episode of bright red blood per rectum a little over a year ago, but no recurrent symptoms.  Suspect this may have been secondary to hemorrhoidal bleeding.  We will arrange for colonoscopy in the near future for screening purposes.  Gluten intolerance: Patient was previously diagnosed with IBS with diarrhea.  She reports all of her symptoms have resolved since starting a gluten-free diet.  She is asking for more information on gluten-free diet.  We will provide her with a handout today.  I did offer a referral to  nutritionist, but she prefers to hold off on this for now she is doing well.  Checking celiac serologies would not be helpful at this point as they are likely wnl as she is following a gluten-free diet.  GERD:  Chronic.  Well-controlled on omeprazole 20 mg daily.  No alarm symptoms.   Plan: Proceed with colonoscopy with propofol with Dr. Gala Romney in the near future. The risks, benefits, and alternatives have been discussed with the patient in detail. The patient states understanding and desires to proceed. ASA 3 Continue gluten-free diet.  Separate handout provided. Requested patient let me know if she would like a referral to a nutritionist. Continue omeprazole 20 mg daily. Follow-up in 6 months or sooner if needed.   Aliene Altes, PA-C [facility]astroenter[facility] 04/13/2021

## 2021-04-13 ENCOUNTER — Encounter: Payer: Self-pay | Admitting: Gastroenterology

## 2021-04-13 ENCOUNTER — Ambulatory Visit (INDEPENDENT_AMBULATORY_CARE_PROVIDER_SITE_OTHER): Payer: Medicare Other | Admitting: Gastroenterology

## 2021-04-13 ENCOUNTER — Other Ambulatory Visit: Payer: Self-pay

## 2021-04-13 VITALS — BP 102/64 | HR 78 | Temp 97.9°F | Ht 65.0 in | Wt 179.0 lb

## 2021-04-13 DIAGNOSIS — K219 Gastro-esophageal reflux disease without esophagitis: Secondary | ICD-10-CM | POA: Diagnosis not present

## 2021-04-13 DIAGNOSIS — K9041 Non-celiac gluten sensitivity: Secondary | ICD-10-CM | POA: Diagnosis not present

## 2021-04-13 DIAGNOSIS — Z1211 Encounter for screening for malignant neoplasm of colon: Secondary | ICD-10-CM | POA: Insufficient documentation

## 2021-04-13 NOTE — Patient Instructions (Signed)
We will arrange for you to have a Colonoscopy in the near future with Dr. Gala Romney. ? ?Continue following a gluten-free diet as you are doing very well with this.  See separate handout. ? ?Continue omeprazole 20 mg daily. ? ?We will follow-up with you in the office in about 6 months.  Do not hesitate to call if you have questions or concerns prior. ? ?It was a pleasure meeting you today! ? ?Aliene Altes, PA-C ?[facility] ? ?

## 2021-04-20 ENCOUNTER — Encounter: Payer: Self-pay | Admitting: *Deleted

## 2021-04-20 ENCOUNTER — Telehealth: Payer: Self-pay | Admitting: *Deleted

## 2021-04-20 MED ORDER — PEG 3350-KCL-NA BICARB-NACL 420 G PO SOLR: ORAL | 0 refills | Status: DC

## 2021-04-20 NOTE — Telephone Encounter (Signed)
Pt returned call. Scheduled for 4/20 at 9:00am. Aware will mail prep instructions with pre-op appt. Rx to be sent to pharmacy. Confirmed pharmacy and address. ?

## 2021-04-20 NOTE — Telephone Encounter (Signed)
LMOVM to call back to schedule TCS with Dr. Gala Romney, propofol, asa 3 ?

## 2021-04-20 NOTE — Addendum Note (Signed)
Addended by: Cheron Every on: 04/20/2021 02:22 PM ? ? Modules accepted: Orders ? ?

## 2021-05-03 ENCOUNTER — Telehealth: Payer: Self-pay | Admitting: Internal Medicine

## 2021-05-03 NOTE — Telephone Encounter (Signed)
Pt wants to cancel her procedure on 4/20 with Dr Gala Romney and will reschedule later.  ?

## 2021-05-03 NOTE — Telephone Encounter (Signed)
Noted. Endo aware to cancel. ?

## 2021-05-30 ENCOUNTER — Other Ambulatory Visit (HOSPITAL_COMMUNITY): Payer: Medicare Other

## 2021-06-01 ENCOUNTER — Encounter (HOSPITAL_COMMUNITY): Payer: Self-pay

## 2021-06-01 ENCOUNTER — Ambulatory Visit (HOSPITAL_COMMUNITY): Admit: 2021-06-01 | Payer: Medicare Other | Admitting: Internal Medicine

## 2021-06-01 SURGERY — COLONOSCOPY WITH PROPOFOL
Anesthesia: Monitor Anesthesia Care

## 2021-10-18 ENCOUNTER — Ambulatory Visit: Payer: Medicare Other | Admitting: Gastroenterology

## 2021-11-24 ENCOUNTER — Other Ambulatory Visit: Payer: Self-pay | Admitting: Internal Medicine

## 2021-11-24 DIAGNOSIS — Z1231 Encounter for screening mammogram for malignant neoplasm of breast: Secondary | ICD-10-CM

## 2021-12-18 ENCOUNTER — Telehealth: Payer: Self-pay | Admitting: Internal Medicine

## 2021-12-18 NOTE — Telephone Encounter (Signed)
Yes needs OV not seen since March 2023

## 2021-12-18 NOTE — Telephone Encounter (Signed)
Pt was scheduled for colonoscopy but cancelled it and now wants to reschedule it.  Unsure if she needed an appointment before or not.

## 2021-12-29 ENCOUNTER — Ambulatory Visit
Admission: RE | Admit: 2021-12-29 | Discharge: 2021-12-29 | Disposition: A | Discharge status: Home / Self Care | Payer: Medicare Other | Source: Ambulatory Visit | Attending: Internal Medicine | Admitting: Internal Medicine

## 2021-12-29 DIAGNOSIS — Z1231 Encounter for screening mammogram for malignant neoplasm of breast: Secondary | ICD-10-CM

## 2022-01-17 ENCOUNTER — Ambulatory Visit (INDEPENDENT_AMBULATORY_CARE_PROVIDER_SITE_OTHER): Payer: Medicare Other | Admitting: Gastroenterology

## 2022-01-17 ENCOUNTER — Encounter: Payer: Self-pay | Admitting: Gastroenterology

## 2022-01-17 ENCOUNTER — Other Ambulatory Visit (HOSPITAL_COMMUNITY)
Admission: RE | Admit: 2022-01-17 | Discharge: 2022-01-17 | Disposition: A | Discharge status: Home / Self Care | Payer: Medicare Other | Source: Ambulatory Visit | Attending: Gastroenterology | Admitting: Gastroenterology

## 2022-01-17 ENCOUNTER — Other Ambulatory Visit: Payer: Self-pay | Admitting: Gastroenterology

## 2022-01-17 ENCOUNTER — Encounter: Payer: Self-pay | Admitting: *Deleted

## 2022-01-17 VITALS — BP 147/85 | HR 67 | Temp 97.2°F | Ht 63.0 in | Wt 190.8 lb

## 2022-01-17 DIAGNOSIS — R300 Dysuria: Secondary | ICD-10-CM | POA: Insufficient documentation

## 2022-01-17 DIAGNOSIS — Z1211 Encounter for screening for malignant neoplasm of colon: Secondary | ICD-10-CM | POA: Diagnosis not present

## 2022-01-17 LAB — URINALYSIS, ROUTINE W REFLEX MICROSCOPIC
Bilirubin Urine: NEGATIVE
Glucose, UA: NEGATIVE mg/dL
Hgb urine dipstick: NEGATIVE
Ketones, ur: NEGATIVE mg/dL
Nitrite: NEGATIVE
Protein, ur: NEGATIVE mg/dL
Specific Gravity, Urine: 1.012 (ref 1.005–1.030)
WBC, UA: >50 WBC/hpf — ABNORMAL HIGH (ref 0–5)
pH: 6 (ref 5.0–8.0)

## 2022-01-17 MED ORDER — PEG 3350-KCL-NA BICARB-NACL 420 G PO SOLR: 4000.0000 mL | Freq: Once | ORAL | 0 refills | Status: AC

## 2022-01-17 MED ORDER — CIPROFLOXACIN HCL 250 MG PO TABS: 250.0000 mg | ORAL_TABLET | Freq: Two times a day (BID) | ORAL | 0 refills | Status: AC

## 2022-01-17 NOTE — Patient Instructions (Signed)
Go to [facility] lab for urine collection. Colonoscopy to be scheduled. See separate instructions.

## 2022-01-17 NOTE — Progress Notes (Signed)
GI Office Note    Referring Provider: Redmond School, MD Primary Care Physician:  Redmond School, MD  Primary Gastroenterologist: Garfield Cornea, MD   Chief Complaint   Chief Complaint  Patient presents with   Follow-up    Pt here to reschedule colonoscopy     History of Present Illness   Diamond Mills is a 66 y.o. female presenting today to reschedule colonoscopy. Last seen 04/2021. H/o GERD, IBS-D, fatty liver with normal LFTs.   Patient had to cancel colonoscopy earlier this year because she was out of town.  Continues to do well for the most part.  Has intermittent diarrhea with certain foods but as long she avoids gluten she does fairly well.  Denies any melena or rectal bleeding.  No abdominal pain usually.  Over the weekend she had diarrhea lasting for about 24 hours until she took Imodium.  She developed dysuria after that she is concerned about a UTI.  Heartburn controlled on omeprazole.  No dysphagia or vomiting.  Financially she really needs to have her colonoscopy before the end of the year because she has met her deductible.  Last EGD: 12/11/2010, normal esophagus, small hiatal hernia, scant tiny antral erosions. Biopsies of the small bowel were performed, negative for any alterations. Last Colonoscopy: 12/11/2010, left-sided diverticulosis, normal terminal ileum.  Random colonic biopsies were taken which were negative for microscopic colitis.   Medications   Current Outpatient Medications  Medication Sig Dispense Refill   amLODipine (NORVASC) 5 MG tablet Take 5 mg by mouth daily.     aspirin EC 81 MG tablet Take 81 mg by mouth daily. Swallow whole.     Coenzyme Q10 (CO Q-10) 200 MG CAPS Take 200 mg by mouth daily.     ELDERBERRY PO Take 2 tablets by mouth at bedtime.     levothyroxine (SYNTHROID) 100 MCG tablet Take 88 mcg by mouth daily before breakfast.     lisinopril (PRINIVIL,ZESTRIL) 20 MG tablet Take 20 mg by mouth daily.     lovastatin (MEVACOR) 10 MG  tablet TAKE ONE TABLET BY MOUTH AT BEDTIME (Patient taking differently: Take 10 mg by mouth at bedtime.) 30 tablet 1   Multiple Vitamin (MULTIVITAMIN) tablet Take 1 tablet by mouth daily.      omeprazole (PRILOSEC) 20 MG capsule Take 20 mg by mouth daily.     No current facility-administered medications for this visit.    Allergies   Allergies as of 01/17/2022 - Review Complete 01/17/2022  Allergen Reaction Noted   Contrast media [iodinated contrast media] Other (See Comments) 12/11/2010   Erythromycin Hives 10/29/2012   Penicillins Hives    Sulfonamide derivatives Other (See Comments)    Vancomycin Palpitations     Past Medical History   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[facility])    Coronary atherosclerosis of native coronary artery    Nonobstructive in 2002 (50-60% D1, anomalous RCA from [facility] with 30% proximal stenosis)   Degenerative joint disease    Fingers   Diverticulosis    On screening colonoscopy   Essential hypertension, benign    Fatty liver    Gastroesophageal reflux disease    Peptic ulcer disease; Hiatal hernia, EGD normal in 2000; colonoscopy - internal hemorrhoids and 2000   Hypothyroidism    Irritable  bowel syndrome    Migraine headache    Occasional   Palpitations     Past Surgical History   Past Surgical History:  Procedure Laterality Date   BLADDER SURGERY  2016   CARDIAC CATHETERIZATION  2002   Multiple procedures prior to 2002; nonobstructive disease   CHOLECYSTECTOMY, LAPAROSCOPIC     COLONOSCOPY  12/11/2010   Diverticulosis, random bx negative for microscopic colitis   ESOPHAGOGASTRODUODENOSCOPY  12/11/2010   Small hh, tiny antral erosions, SB bx negative for celiac   KYPHOPLASTY N/A 08/12/2019   Procedure: THORACIC 12 AND LUMBAR 4 KYPHOPLASTY;  Surgeon:  Phylliss Bob, MD;  Location: [place];  Service: Orthopedics;  Laterality: N/A;   SHOULDER SURGERY     Spur   TUBAL LIGATION     Bilateral    Past Family History   Family History  Adopted: Yes  Problem Relation Age of Onset   Hypertension Sister    Colon cancer Neg Hx    Breast cancer Neg Hx     Past Social History   Social History   Socioeconomic History   Marital status: Married    Spouse name: Not on file   Number of children: 2   Years of education: Not on file   Highest education level: Not on file  Occupational History   Occupation: CNA  Tobacco Use   Smoking status: Former    Packs/day: 2.00    Years: 15.00    Total pack years: 30.00    Types: Cigarettes    Quit date: 07/13/1981    Years since quitting: 40.5   Smokeless tobacco: Never  Vaping Use   Vaping Use: Never used  Substance and Sexual Activity   Alcohol use: No   Drug use: No   Sexual activity: Yes    Birth control/protection: Post-menopausal  Other Topics Concern   Not on file  Social History Narrative   Not on file   Social Determinants of Health   Financial Resource Strain: Not on file  Food Insecurity: Not on file  Transportation Needs: Not on file  Physical Activity: Not on file  Stress: Not on file  Social Connections: Not on file  Intimate Partner Violence: Not on file    Review of Systems   General: Negative for anorexia, weight loss, fever, chills, fatigue, weakness. Eyes: Negative for vision changes.  ENT: Negative for hoarseness, difficulty swallowing , nasal congestion. CV: Negative for chest pain, angina, palpitations, dyspnea on exertion, peripheral edema.  Respiratory: Negative for dyspnea at rest, dyspnea on exertion, cough, sputum, wheezing.  GI: See history of present illness. GU:  Negative for  hematuria, urinary incontinence, urinary frequency, nocturnal urination. See hpi MS: Negative for joint pain, low back pain.  Derm: Negative for rash or itching.  Neuro:  Negative for weakness, abnormal sensation, seizure, frequent headaches, memory loss,  confusion.  Psych: Negative for anxiety, depression, suicidal ideation, hallucinations.  Endo: Negative for unusual weight change.  Heme: Negative for bruising or bleeding. Allergy: Negative for rash or hives.  Physical Exam   BP (!) 147/85   Pulse 67   Temp (!) 97.2 F (36.2 C)   Ht _0  (1.6 m)   Wt 190 lb 12.8 oz (86.5 kg)   LMP 12/10/2001   BMI 33.80 kg/m    General: Well-nourished, well-developed in no acute distress.  Head: Normocephalic, atraumatic.   Eyes: Conjunctiva pink, no icterus. Mouth: Oropharyngeal mucosa moist and pink , no lesions erythema or exudate. Neck: Supple without  thyromegaly, masses, or lymphadenopathy.  Lungs: Clear to auscultation bilaterally.  Heart: Regular rate and rhythm, no murmurs rubs or gallops.  Abdomen: Bowel sounds are normal, nontender, nondistended, no hepatosplenomegaly or masses,  no abdominal bruits or hernia, no rebound or guarding.   Rectal: not performed Extremities: No lower extremity edema. No clubbing or deformities.  Neuro: Alert and oriented x 4 , grossly normal neurologically.  Skin: Warm and dry, no rash or jaundice.   Psych: Alert and cooperative, normal mood and affect.  Labs   10/2021: LFTs normal.  Imaging Studies   MM 3D SCREEN BREAST BILATERAL  Result Date: 01/05/2022 CLINICAL DATA:  Screening. EXAM: DIGITAL SCREENING BILATERAL MAMMOGRAM WITH TOMOSYNTHESIS AND CAD TECHNIQUE: Bilateral screening digital craniocaudal and mediolateral oblique mammograms were obtained. Bilateral screening digital breast tomosynthesis was performed. The images were evaluated with computer-aided detection. COMPARISON:  Previous exam(s). ACR Breast Density Category a: The breast tissue is almost entirely fatty. FINDINGS: There are no findings suspicious for malignancy. IMPRESSION: No mammographic evidence of malignancy. A result letter of this screening  mammogram will be mailed directly to the patient. RECOMMENDATION: Screening mammogram in one year. (Code:SM-B-01Y) BI-RADS CATEGORY  1: Negative. Electronically Signed   By: Ammie Ferrier M.D.   On: 01/05/2022 12:20    Assessment   Colon cancer screening: No history of colon polyps.  No family history of colon cancer.  Colonoscopy in 2012 with left-sided diverticulosis, normal TI.  Random colon biopsies negative for microscopic colitis.  With gluten avoidance her IBS type symptoms resolved.  Likely some gluten intolerance.  Small bowel biopsies were negative for celiac in 2012.  Dysuria: Check for UTI  PLAN   UA with culture  Colonoscopy.  ASA 3.  Patient prefers Dr. Gala Romney as primary gastroenterologist (initially seen remotely, has seen Dr. Jenetta Downer more recently) but because she financially needs to try and have her colonoscopy while her deductible has been met, she would like to have colonoscopy completed with any provider.  I have discussed the risks, alternatives, benefits with regards to but not limited to the risk of reaction to medication, bleeding, infection, perforation and the patient is agreeable to proceed. Written consent to be obtained.    Laureen Ochs. Bobby Rumpf, [place], [facility]

## 2022-01-19 LAB — URINE CULTURE: Culture: 10000 — AB

## 2022-01-30 NOTE — Patient Instructions (Signed)
Diamond Mills  01/30/2022     '@PREFPERIOPPHARMACY'$ @   Your procedure is scheduled on  02/08/2022.   Report to [facility] at  0730  A.M.   Call this number if you have problems the morning of surgery:  [phone]  If you experience any cold or flu symptoms such as cough, fever, chills, shortness of breath, etc. between now and your scheduled surgery, please notify us at the above number.   Remember:  Follow the diet and prep instructions given to you by the office.     Take these medicines the morning of surgery with A SIP OF WATER            amlodipine, levothyroxine, omeprazole.     Do not wear jewelry, make-up or nail polish.  Do not wear lotions, powders, or perfumes, or deodorant.  Do not shave 48 hours prior to surgery.  Men may shave face and neck.  Do not bring valuables to the hospital.  [facility] is not responsible for any belongings or valuables.  Contacts, dentures or bridgework may not be worn into surgery.  Leave your suitcase in the car.  After surgery it may be brought to your room.  For patients admitted to the hospital, discharge time will be determined by your treatment team.  Patients discharged the day of surgery will not be allowed to drive home and must have someone with them for 24 hours.    Special instructions:   DO NOT smoke tobacco or vape for 24 hours before your procedure.  Please read over the following fact sheets that you were given. Anesthesia Post-op Instructions and Care and Recovery After Surgery      Colonoscopy, Adult, Care After The following information offers guidance on how to care for yourself after your procedure. Your health care provider may also give you more specific instructions. If you have problems or questions, contact your health care provider. What can I expect after the procedure? After the procedure, it is common to have: A small amount of blood in your stool for 24 hours after the procedure. Some  gas. Mild cramping or bloating of your abdomen. Follow these instructions at home: Eating and drinking  Drink enough fluid to keep your urine pale yellow. Follow instructions from your health care provider about eating or drinking restrictions. Resume your normal diet as told by your health care provider. Avoid heavy or fried foods that are hard to digest. Activity Rest as told by your health care provider. Avoid sitting for a long time without moving. Get up to take short walks every 1-2 hours. This is important to improve blood flow and breathing. Ask for help if you feel weak or unsteady. Return to your normal activities as told by your health care provider. Ask your health care provider what activities are safe for you. Managing cramping and bloating  Try walking around when you have cramps or feel bloated. If directed, apply heat to your abdomen as told by your health care provider. Use the heat source that your health care provider recommends, such as a moist heat pack or a heating pad. Place a towel between your skin and the heat source. Leave the heat on for 20-30 minutes. Remove the heat if your skin turns bright red. This is especially important if you are unable to feel pain, heat, or cold. You have a greater risk of getting burned. General instructions If you were given a sedative during  the procedure, it can affect you for several hours. Do not drive or operate machinery until your health care provider says that it is safe. For the first 24 hours after the procedure: Do not sign important documents. Do not drink alcohol. Do your regular daily activities at a slower pace than normal. Eat soft foods that are easy to digest. Take over-the-counter and prescription medicines only as told by your health care provider. Keep all follow-up visits. This is important. Contact a health care provider if: You have blood in your stool 2-3 days after the procedure. Get help right away  if: You have more than a small spotting of blood in your stool. You have large blood clots in your stool. You have swelling of your abdomen. You have nausea or vomiting. You have a fever. You have increasing pain in your abdomen that is not relieved with medicine. These symptoms may be an emergency. Get help right away. Call 911. Do not wait to see if the symptoms will go away. Do not drive yourself to the hospital. Summary After the procedure, it is common to have a small amount of blood in your stool. You may also have mild cramping and bloating of your abdomen. If you were given a sedative during the procedure, it can affect you for several hours. Do not drive or operate machinery until your health care provider says that it is safe. Get help right away if you have a lot of blood in your stool, nausea or vomiting, a fever, or increased pain in your abdomen. This information is not intended to replace advice given to you by your health care provider. Make sure you discuss any questions you have with your health care provider. Document Revised: 09/21/2020 Document Reviewed: 09/21/2020 Elsevier Patient Education  [place] After The following information offers guidance on how to care for yourself after your procedure. Your health care provider may also give you more specific instructions. If you have problems or questions, contact your health care provider. What can I expect after the procedure? After the procedure, it is common to have: Tiredness. Little or no memory about what happened during or after the procedure. Impaired judgment when it comes to making decisions. Nausea or vomiting. Some trouble with balance. Follow these instructions at home: For the time period you were told by your health care provider:  Rest. Do not participate in activities where you could fall or become injured. Do not drive or use machinery. Do not drink  alcohol. Do not take sleeping pills or medicines that cause drowsiness. Do not make important decisions or sign legal documents. Do not take care of children on your own. Medicines Take over-the-counter and prescription medicines only as told by your health care provider. If you were prescribed antibiotics, take them as told by your health care provider. Do not stop using the antibiotic even if you start to feel better. Eating and drinking Follow instructions from your health care provider about what you may eat and drink. Drink enough fluid to keep your urine pale yellow. If you vomit: Drink clear fluids slowly and in small amounts as you are able. Clear fluids include water, ice chips, low-calorie sports drinks, and fruit juice that has water added to it (diluted fruit juice). Eat light and bland foods in small amounts as you are able. These foods include bananas, applesauce, rice, lean meats, toast, and crackers. General instructions  Have a responsible adult stay with you for  the time you are told. It is important to have someone help care for you until you are awake and alert. If you have sleep apnea, surgery and some medicines can increase your risk for breathing problems. Follow instructions from your health care provider about wearing your sleep device: When you are sleeping. This includes during daytime naps. While taking prescription pain medicines, sleeping medicines, or medicines that make you drowsy. Do not use any products that contain nicotine or tobacco. These products include cigarettes, chewing tobacco, and vaping devices, such as e-cigarettes. If you need help quitting, ask your health care provider. Contact a health care provider if: You feel nauseous or vomit every time you eat or drink. You feel light-headed. You are still sleepy or having trouble with balance after 24 hours. You get a rash. You have a fever. You have redness or swelling around the IV site. Get help  right away if: You have trouble breathing. You have new confusion after you get home. These symptoms may be an emergency. Get help right away. Call 911. Do not wait to see if the symptoms will go away. Do not drive yourself to the hospital. This information is not intended to replace advice given to you by your health care provider. Make sure you discuss any questions you have with your health care provider. Document Revised: 06/26/2021 Document Reviewed: 06/26/2021 Elsevier Patient Education  [place].

## 2022-02-01 ENCOUNTER — Encounter (HOSPITAL_COMMUNITY)
Admission: RE | Admit: 2022-02-01 | Discharge: 2022-02-01 | Disposition: A | Discharge status: Home / Self Care | Payer: Medicare Other | Source: Ambulatory Visit | Attending: Internal Medicine | Admitting: Internal Medicine

## 2022-02-01 ENCOUNTER — Encounter (HOSPITAL_COMMUNITY): Payer: Self-pay

## 2022-02-01 ENCOUNTER — Other Ambulatory Visit: Payer: Self-pay

## 2022-02-01 VITALS — BP 160/68 | HR 71 | Temp 97.4°F | Resp 18 | Ht 63.0 in | Wt 190.8 lb

## 2022-02-01 DIAGNOSIS — I10 Essential (primary) hypertension: Secondary | ICD-10-CM | POA: Insufficient documentation

## 2022-02-01 DIAGNOSIS — Z01810 Encounter for preprocedural cardiovascular examination: Secondary | ICD-10-CM | POA: Insufficient documentation

## 2022-02-08 ENCOUNTER — Ambulatory Visit (HOSPITAL_COMMUNITY): Payer: Medicare Other | Admitting: Anesthesiology

## 2022-02-08 ENCOUNTER — Encounter (HOSPITAL_COMMUNITY)
Admission: RE | Disposition: A | Discharge status: Home / Self Care | Payer: Self-pay | Source: Home / Self Care | Attending: Internal Medicine

## 2022-02-08 ENCOUNTER — Ambulatory Visit (HOSPITAL_BASED_OUTPATIENT_CLINIC_OR_DEPARTMENT_OTHER): Payer: Medicare Other | Admitting: Anesthesiology

## 2022-02-08 ENCOUNTER — Ambulatory Visit (HOSPITAL_COMMUNITY)
Admission: RE | Admit: 2022-02-08 | Discharge: 2022-02-08 | Disposition: A | Discharge status: Home / Self Care | Payer: Medicare Other | Attending: Internal Medicine | Admitting: Internal Medicine

## 2022-02-08 ENCOUNTER — Other Ambulatory Visit: Payer: Self-pay

## 2022-02-08 ENCOUNTER — Encounter (HOSPITAL_COMMUNITY): Payer: Self-pay

## 2022-02-08 DIAGNOSIS — I2510 Atherosclerotic heart disease of native coronary artery without angina pectoris: Secondary | ICD-10-CM | POA: Diagnosis not present

## 2022-02-08 DIAGNOSIS — E039 Hypothyroidism, unspecified: Secondary | ICD-10-CM | POA: Diagnosis not present

## 2022-02-08 DIAGNOSIS — Z1211 Encounter for screening for malignant neoplasm of colon: Secondary | ICD-10-CM | POA: Diagnosis present

## 2022-02-08 DIAGNOSIS — K648 Other hemorrhoids: Secondary | ICD-10-CM | POA: Insufficient documentation

## 2022-02-08 DIAGNOSIS — I69398 Other sequelae of cerebral infarction: Secondary | ICD-10-CM | POA: Diagnosis not present

## 2022-02-08 DIAGNOSIS — Z87891 Personal history of nicotine dependence: Secondary | ICD-10-CM | POA: Diagnosis not present

## 2022-02-08 DIAGNOSIS — K219 Gastro-esophageal reflux disease without esophagitis: Secondary | ICD-10-CM | POA: Insufficient documentation

## 2022-02-08 DIAGNOSIS — H538 Other visual disturbances: Secondary | ICD-10-CM | POA: Diagnosis not present

## 2022-02-08 DIAGNOSIS — D123 Benign neoplasm of transverse colon: Secondary | ICD-10-CM | POA: Diagnosis not present

## 2022-02-08 DIAGNOSIS — I10 Essential (primary) hypertension: Secondary | ICD-10-CM | POA: Insufficient documentation

## 2022-02-08 DIAGNOSIS — K5730 Diverticulosis of large intestine without perforation or abscess without bleeding: Secondary | ICD-10-CM | POA: Insufficient documentation

## 2022-02-08 DIAGNOSIS — D120 Benign neoplasm of cecum: Secondary | ICD-10-CM | POA: Diagnosis not present

## 2022-02-08 HISTORY — PX: COLONOSCOPY WITH PROPOFOL: SHX5780

## 2022-02-08 HISTORY — PX: POLYPECTOMY: SHX5525

## 2022-02-08 SURGERY — COLONOSCOPY WITH PROPOFOL
Anesthesia: General

## 2022-02-08 MED ORDER — PHENYLEPHRINE 80 MCG/ML (10ML) SYRINGE FOR IV PUSH (FOR BLOOD PRESSURE SUPPORT)
PREFILLED_SYRINGE | INTRAVENOUS | Status: DC | PRN
  Administered 2022-02-08 (×2): 80 ug via INTRAVENOUS

## 2022-02-08 MED ORDER — LIDOCAINE HCL (CARDIAC) PF 100 MG/5ML IV SOSY
PREFILLED_SYRINGE | INTRAVENOUS | Status: DC | PRN
  Administered 2022-02-08: 50 mg via INTRAVENOUS

## 2022-02-08 MED ORDER — LACTATED RINGERS IV SOLN
INTRAVENOUS | Status: DC
  Administered 2022-02-08: 1000 mL via INTRAVENOUS

## 2022-02-08 MED ORDER — STERILE WATER FOR IRRIGATION IR SOLN
Status: DC | PRN
  Administered 2022-02-08: 100 mL

## 2022-02-08 MED ORDER — PROPOFOL 10 MG/ML IV BOLUS
INTRAVENOUS | Status: DC | PRN
  Administered 2022-02-08: 30 mg via INTRAVENOUS
  Administered 2022-02-08: 50 mg via INTRAVENOUS
  Administered 2022-02-08: 90 mg via INTRAVENOUS
  Administered 2022-02-08 (×2): 50 mg via INTRAVENOUS
  Administered 2022-02-08: 30 mg via INTRAVENOUS

## 2022-02-08 NOTE — Anesthesia Postprocedure Evaluation (Signed)
Anesthesia Post Note  Patient: Diamond Mills  Procedure(s) Performed: COLONOSCOPY WITH PROPOFOL POLYPECTOMY  Patient location during evaluation: Phase II Anesthesia Type: General Level of consciousness: awake and alert and oriented Pain management: pain level controlled Vital Signs Assessment: post-procedure vital signs reviewed and stable Respiratory status: spontaneous breathing, nonlabored ventilation and respiratory function stable Cardiovascular status: blood pressure returned to baseline and stable Postop Assessment: no apparent nausea or vomiting Anesthetic complications: no  No notable events documented.   Last Vitals:  Vitals:   02/08/22 0900 02/08/22 0906  BP: (!) 93/38 (!) 101/50  Pulse: 64 67  Resp: 20 20  Temp: 36.9 C   SpO2: 96% 97%    Last Pain:  Vitals:   02/08/22 0900  TempSrc: Oral  PainSc: 0-No pain                 Diamond Mills

## 2022-02-08 NOTE — Anesthesia Preprocedure Evaluation (Signed)
Anesthesia Evaluation  Patient identified by MRN, date of birth, ID band Patient awake    Reviewed: Allergy & Precautions, H&P , NPO status , Patient's Chart, lab work & pertinent test results  Airway Mallampati: II  TM Distance: >3 FB Neck ROM: Full    Dental  (+) Dental Advisory Given, Edentulous Upper, Missing   Pulmonary neg pulmonary ROS, former smoker   Pulmonary exam normal breath sounds clear to auscultation       Cardiovascular hypertension, Pt. on medications + CAD (as per patient, she had caradiac cath 10 years ago,75% blockage in one of her arteries anot amenable for stenting, hysterctomy in 2015 without any complications)  Normal cardiovascular exam Rhythm:Regular Rate:Normal  01-Feb-2022 10:00:11 [facility] ROUTINE RECORD 1955/11/18 (59 yr) Female Caucasian Vent. rate 65 BPM PR interval 154 ms QRS duration 74 ms QT/QTcB 398/413 ms P-R-T axes 30 -3 41 Normal sinus rhythm Low voltage QRS Nonspecific ST abnormality Abnormal ECG When compared with ECG of 10-Aug-2019 09:38, No significant change since last tracing PREVIOUS ECG IS PRESENT Confirmed by Asencion Noble [phone]) on 02/02/2022 11:19:45 PM   Neuro/Psych  Headaches PSYCHIATRIC DISORDERS Anxiety Depression    CVA (left eye vision problems), Residual Symptoms    GI/Hepatic Neg liver ROS,GERD  Medicated and Controlled,,  Endo/Other  Hypothyroidism    Renal/GU negative Renal ROS  negative genitourinary   Musculoskeletal  (+) Arthritis , Osteoarthritis,    Abdominal   Peds negative pediatric ROS (+)  Hematology negative hematology ROS (+)   Anesthesia Other Findings Syncopal attacks  Reproductive/Obstetrics negative OB ROS                             Anesthesia Physical Anesthesia Plan  ASA: 3  Anesthesia Plan: General   Post-op Pain Management: Minimal or no pain anticipated   Induction:  Intravenous  PONV Risk Score and Plan: Propofol infusion  Airway Management Planned: Nasal Cannula and Natural Airway  Additional Equipment:   Intra-op Plan:   Post-operative Plan:   Informed Consent: I have reviewed the patients History and Physical, chart, labs and discussed the procedure including the risks, benefits and alternatives for the proposed anesthesia with the patient or authorized representative who has indicated his/her understanding and acceptance.     Dental advisory given  Plan Discussed with: CRNA and Surgeon  Anesthesia Plan Comments:         Anesthesia Quick Evaluation

## 2022-02-08 NOTE — Discharge Instructions (Addendum)
  Colonoscopy Discharge Instructions  Read the instructions outlined below and refer to this sheet in the next few weeks. These discharge instructions provide you with general information on caring for yourself after you leave the hospital. Your doctor may also give you specific instructions. While your treatment has been planned according to the most current medical practices available, unavoidable complications occasionally occur.   ACTIVITY You may resume your regular activity, but move at a slower pace for the next 24 hours.  Take frequent rest periods for the next 24 hours.  Walking will help get rid of the air and reduce the bloated feeling in your belly (abdomen).  No driving for 24 hours (because of the medicine (anesthesia) used during the test).   Do not sign any important legal documents or operate any machinery for 24 hours (because of the anesthesia used during the test).  NUTRITION Drink plenty of fluids.  You may resume your normal diet as instructed by your doctor.  Begin with a light meal and progress to your normal diet. Heavy or fried foods are harder to digest and may make you feel sick to your stomach (nauseated).  Avoid alcoholic beverages for 24 hours or as instructed.  MEDICATIONS You may resume your normal medications unless your doctor tells you otherwise.  WHAT YOU CAN EXPECT TODAY Some feelings of bloating in the abdomen.  Passage of more gas than usual.  Spotting of blood in your stool or on the toilet paper.  IF YOU HAD POLYPS REMOVED DURING THE COLONOSCOPY: No aspirin products for 7 days or as instructed.  No alcohol for 7 days or as instructed.  Eat a soft diet for the next 24 hours.  FINDING OUT THE RESULTS OF YOUR TEST Not all test results are available during your visit. If your test results are not back during the visit, make an appointment with your caregiver to find out the results. Do not assume everything is normal if you have not heard from your  caregiver or the medical facility. It is important for you to follow up on all of your test results.  SEEK IMMEDIATE MEDICAL ATTENTION IF: You have more than a spotting of blood in your stool.  Your belly is swollen (abdominal distention).  You are nauseated or vomiting.  You have a temperature over 101.  You have abdominal pain or discomfort that is severe or gets worse throughout the day.   Your colonoscopy revealed 3 polyp(s) which I removed successfully. Await pathology results, my office will contact you. I recommend repeating colonoscopy in 5 years for surveillance purposes.   You also have diverticulosis and internal hemorrhoids. I would recommend increasing fiber in your diet or adding OTC Benefiber/Metamucil. Be sure to drink at least 4 to 6 glasses of water daily. Follow-up with GI as needed.   I hope you have a great rest of your week!  Elon Alas. Abbey Chatters, D.O. Gastroenterology and Hepatology [facility]

## 2022-02-08 NOTE — Op Note (Signed)
[facility] Patient Name: Diamond Mills Procedure Date: 02/08/2022 8:27 AM MRN: [id number] Date of Birth: [birth year] Attending MD: Elon Alas. Abbey Chatters , [place], [id number] CSN: [id number] Age: 66 Admit Type: Outpatient Procedure:                Colonoscopy Indications:              Screening for colorectal malignant neoplasm Providers:                Elon Alas. Abbey Chatters, DO, Charlsie Quest. Theda Sers RN, RN,                            Caprice Kluver, Ladoris Gene Technician, Technician Referring MD:              Medicines:                See the Anesthesia note for documentation of the                            administered medications Complications:            No immediate complications. Estimated Blood Loss:     Estimated blood loss was minimal. Procedure:                Pre-Anesthesia Assessment:                           - The anesthesia plan was to use monitored                            anesthesia care (MAC).                           After obtaining informed consent, the colonoscope                            was passed under direct vision. Throughout the                            procedure, the patient's blood pressure, pulse, and                            oxygen saturations were monitored continuously. The                            [id number]-HQ190L ([id number]) scope was introduced through                            the anus and advanced to the the cecum, identified                            by appendiceal orifice and ileocecal valve. The                            colonoscopy was performed without difficulty. The  patient tolerated the procedure well. The quality                            of the bowel preparation was evaluated using the                            BBPS [facility] Bowel Preparation Scale) with scores                            of: Right Colon = 3, Transverse Colon = 3 and Left                            Colon = 3 (entire mucosa seen well with no residual                             staining, small fragments of stool or opaque                            liquid). The total BBPS score equals 9. Scope In: 8:39:28 AM Scope Out: 8:57:53 AM Scope Withdrawal Time: 0 hours 14 minutes 30 seconds  Total Procedure Duration: 0 hours 18 minutes 25 seconds  Findings:      The perianal and digital rectal examinations were normal.      Non-bleeding internal hemorrhoids were found during endoscopy.      Multiple large-mouthed and small-mouthed diverticula were found in the       sigmoid colon.      Two sessile polyps were found in the transverse colon. The polyps were 4       to 6 mm in size. These polyps were removed with a cold snare. Resection       and retrieval were complete.      A 5 mm polyp was found in the cecum. The polyp was sessile. The polyp       was removed with a cold snare. Resection and retrieval were complete.      The exam was otherwise without abnormality. Impression:               - Non-bleeding internal hemorrhoids.                           - Diverticulosis in the sigmoid colon.                           - Two 4 to 6 mm polyps in the transverse colon,                            removed with a cold snare. Resected and retrieved.                           - One 5 mm polyp in the cecum, removed with a cold                            snare. Resected and retrieved.                           -  The examination was otherwise normal. Moderate Sedation:      Per Anesthesia Care Recommendation:           - Patient has a contact number available for                            emergencies. The signs and symptoms of potential                            delayed complications were discussed with the                            patient. Return to normal activities tomorrow.                            Written discharge instructions were provided to the                            patient.                           - Resume previous diet.                            - Continue present medications.                           - Await pathology results.                           - Repeat colonoscopy in 5 years for surveillance.                           - Return to GI clinic PRN. Procedure Code(s):        --- Professional ---                           [phone], Colonoscopy, flexible; with removal of                            tumor(s), polyp(s), or other lesion(s) by snare                            technique Diagnosis Code(s):        --- Professional ---                           Z12.11, Encounter for screening for malignant                            neoplasm of colon                           K64.8, Other hemorrhoids                           D12.3, Benign neoplasm of transverse colon (hepatic  flexure or splenic flexure)                           D12.0, Benign neoplasm of cecum                           K57.30, Diverticulosis of large intestine without                            perforation or abscess without bleeding CPT copyright 2022 American Medical Association. All rights reserved. The codes documented in this report are preliminary and upon coder review may  be revised to meet current compliance requirements. Elon Alas. Abbey Chatters, DO [place] Abbey Chatters, DO 02/08/2022 9:03:36 AM This report has been signed electronically. Number of Addenda: 0

## 2022-02-08 NOTE — H&P (Signed)
Primary Care Physician:  Redmond School, MD Primary Gastroenterologist:  Dr. Abbey Chatters  Pre-Procedure History & Physical: HPI:  Diamond Mills is a 65 y.o. female is here for a colonoscopy for colon cancer screening purposes.  Patient denies any family history of colorectal cancer.  No melena or hematochezia.  No abdominal pain or unintentional weight loss.  No change in bowel habits.  Overall feels well from a GI standpoint.  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)   Degenerative joint disease    Fingers   Diverticulosis    On screening colonoscopy   Essential hypertension, benign    Fatty liver    Gastroesophageal reflux disease    Peptic ulcer disease; Hiatal hernia, EGD normal in 2000; colonoscopy - internal hemorrhoids and 2000   Hypothyroidism    Irritable bowel syndrome    Migraine headache    Occasional   Palpitations     Past Surgical History:  Procedure Laterality Date   BLADDER SURGERY  2016   CARDIAC CATHETERIZATION  2002   Multiple procedures prior to 2002; nonobstructive disease   CHOLECYSTECTOMY, LAPAROSCOPIC     COLONOSCOPY  12/11/2010   Diverticulosis, random bx negative for microscopic colitis   ESOPHAGOGASTRODUODENOSCOPY  12/11/2010   Small hh, tiny antral erosions, SB bx negative for celiac   KYPHOPLASTY N/A 08/12/2019   Procedure: THORACIC 12 AND LUMBAR 4 KYPHOPLASTY;  Surgeon: Phylliss Bob, MD;  Location: [place];  Service: Orthopedics;  Laterality: N/A;   SHOULDER SURGERY Right    Spur   TUBAL LIGATION     Bilateral    Prior to Admission medications   Medication Sig Start Date End Date Taking? Authorizing  Provider  amLODipine (NORVASC) 5 MG tablet Take 5 mg by mouth daily.   Yes [provider]  aspirin EC 81 MG tablet Take 81 mg by mouth daily. Swallow whole.   Yes [provider]  Calcium Carbonate-Vit D-Min (CALTRATE 600+D PLUS MINERALS) 600-800 MG-UNIT CHEW Chew 1 each by mouth daily.   Yes [provider]  Coenzyme Q10 (CO Q-10) 200 MG CAPS Take 200 mg by mouth daily.   Yes [provider]  diclofenac Sodium (VOLTAREN) 1 % GEL Apply 1 Application topically 2 (two) times daily as needed (joint pain).   Yes [provider]  ELDERBERRY PO Take 2 tablets by mouth at bedtime.   Yes [provider]  ibuprofen (ADVIL) 200 MG tablet Take 400-600 mg by mouth every 8 (eight) hours as needed for moderate pain.   Yes [provider]  levothyroxine (SYNTHROID) 75 MCG tablet Take 75 mcg by mouth daily before breakfast.   Yes [provider]  lisinopril (PRINIVIL,ZESTRIL) 20 MG tablet Take 20 mg by mouth daily.   Yes [provider]  lovastatin (MEVACOR) 10 MG tablet TAKE ONE TABLET BY MOUTH AT BEDTIME Patient taking differently: Take 10 mg by mouth at bedtime. 12/13/14  Yes Satira Sark, MD  Multiple Vitamins-Minerals (MULTIVITAMIN ADULTS 50+ PO) Take 2 each by mouth daily.   Yes [provider]  omeprazole (PRILOSEC) 20 MG capsule Take 20 mg by mouth daily.  Yes [provider]    Allergies as of 01/17/2022 - Review Complete 01/17/2022  Allergen Reaction Noted   Contrast media [iodinated contrast media] Other (See Comments) 12/11/2010   Erythromycin Hives 10/29/2012   Penicillins Hives    Sulfonamide derivatives Other (See Comments)    Vancomycin Palpitations     Family History  Adopted: Yes  Problem Relation Age of Onset   Hypertension Sister    Colon cancer Neg Hx    Breast cancer Neg Hx     Social History   Socioeconomic History   Marital status: Married    Spouse name: Not on file    Number of children: 2   Years of education: Not on file   Highest education level: Not on file  Occupational History   Occupation: CNA  Tobacco Use   Smoking status: Former    Packs/day: 2.00    Years: 15.00    Total pack years: 30.00    Types: Cigarettes    Quit date: 07/13/1981    Years since quitting: 40.6   Smokeless tobacco: Never  Vaping Use   Vaping Use: Never used  Substance and Sexual Activity   Alcohol use: No   Drug use: No   Sexual activity: Yes    Birth control/protection: Post-menopausal  Other Topics Concern   Not on file  Social History Narrative   Not on file   Social Determinants of Health   Financial Resource Strain: Not on file  Food Insecurity: Not on file  Transportation Needs: Not on file  Physical Activity: Not on file  Stress: Not on file  Social Connections: Not on file  Intimate Partner Violence: Not on file    Review of Systems: See HPI, otherwise negative ROS  Physical Exam: Vital signs in last 24 hours: Pulse Rate:  [80] 80 (12/28 0752) Resp:  [19] 19 (12/28 0752) BP: (168)/(76) 168/76 (12/28 0752) SpO2:  [100 %] 100 % (12/28 0752) Weight:  [86.2 kg] 86.2 kg (12/28 0752)   General:   Alert,  Well-developed, well-nourished, pleasant and cooperative in NAD Head:  Normocephalic and atraumatic. Eyes:  Sclera clear, no icterus.   Conjunctiva pink. Ears:  Normal auditory acuity. Nose:  No deformity, discharge,  or lesions. Msk:  Symmetrical without gross deformities. Normal posture. Extremities:  Without clubbing or edema. Neurologic:  Alert and  oriented x4;  grossly normal neurologically. Skin:  Intact without significant lesions or rashes. Psych:  Alert and cooperative. Normal mood and affect.  Impression/Plan: Diamond Mills is here for a colonoscopy to be performed for colon cancer screening purposes.  The risks of the procedure including infection, bleed, or perforation as well as benefits, limitations, alternatives and  imponderables have been reviewed with the patient. Questions have been answered. All parties agreeable.

## 2022-02-08 NOTE — Transfer of Care (Signed)
Immediate Anesthesia Transfer of Care Note  Patient: Diamond Mills  Procedure(s) Performed: COLONOSCOPY WITH PROPOFOL POLYPECTOMY  Patient Location: Short Stay  Anesthesia Type:General  Level of Consciousness: awake  Airway & Oxygen Therapy: Patient Spontanous Breathing  Post-op Assessment: Report given to RN and Post -op Vital signs reviewed and stable  Post vital signs: Reviewed and stable  Last Vitals:  Vitals Value Taken Time  BP 93/38 02/08/22 0900  Temp 36.9 C 02/08/22 0900  Pulse 64 02/08/22 0900  Resp 20 02/08/22 0900  SpO2 96 % 02/08/22 0900    Last Pain:  Vitals:   02/08/22 0900  TempSrc: Oral  PainSc: 0-No pain      Patients Stated Pain Goal: 6 (86/16/83 7290)  Complications: No notable events documented.

## 2022-02-09 LAB — SURGICAL PATHOLOGY

## 2022-02-15 ENCOUNTER — Encounter (HOSPITAL_COMMUNITY): Payer: Self-pay | Admitting: Internal Medicine

## 2022-04-28 IMAGING — DX DG LUMBAR SPINE COMPLETE 4+V
5 series · 5 of 5 positions shown · non-contrast
Comparison: None.

CLINICAL DATA: Knocked down by a dog.  Left mid to lower back pain.

EXAM:
LUMBAR SPINE - COMPLETE 4+ VIEW

[l-spine ap]
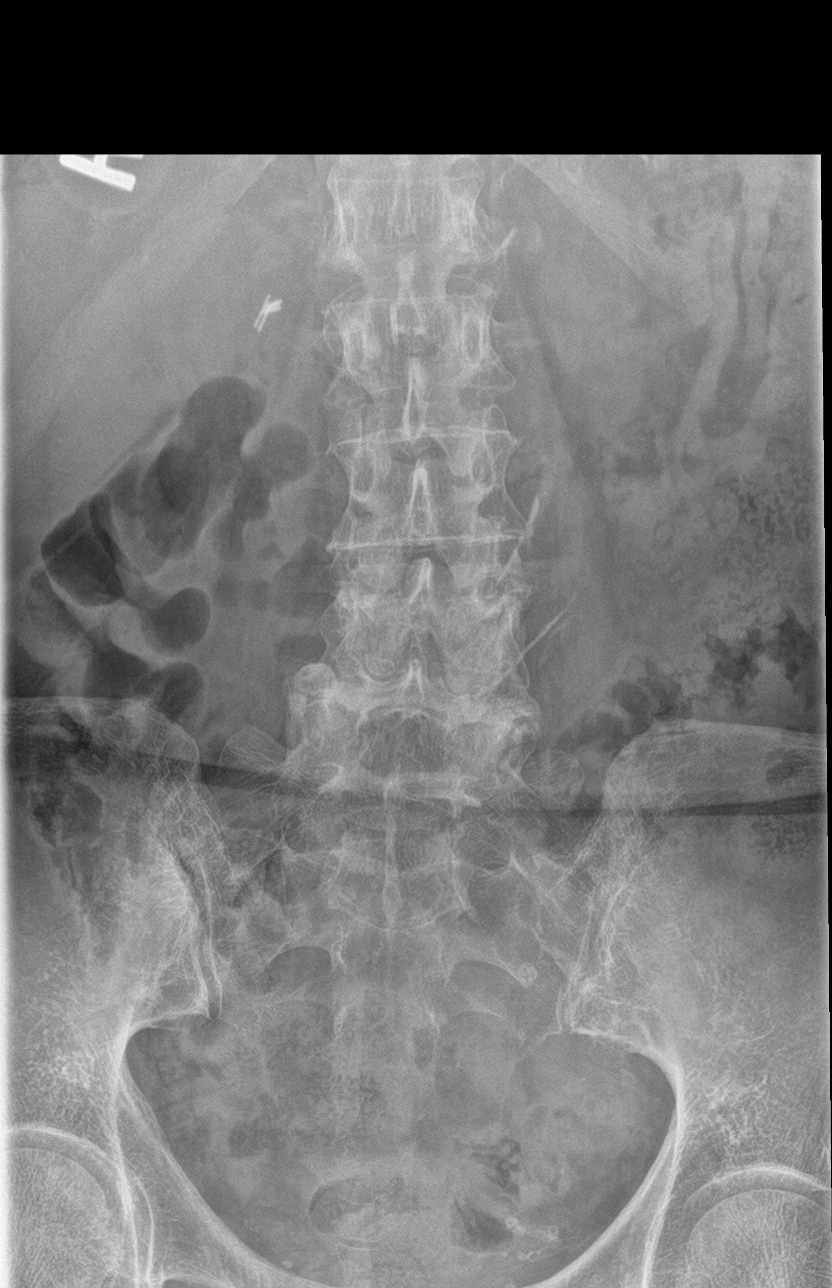

[l-spine obl (1 of 2)]
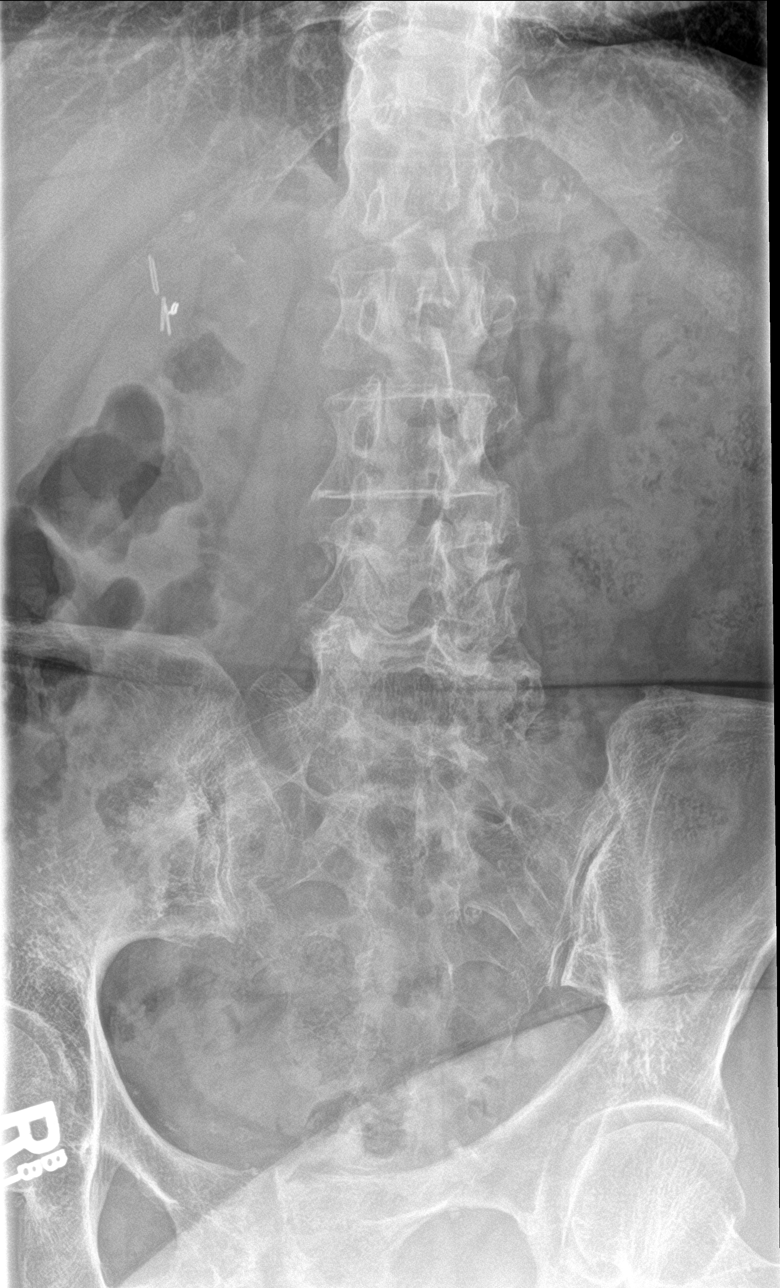

[l-spine obl (2 of 2)]
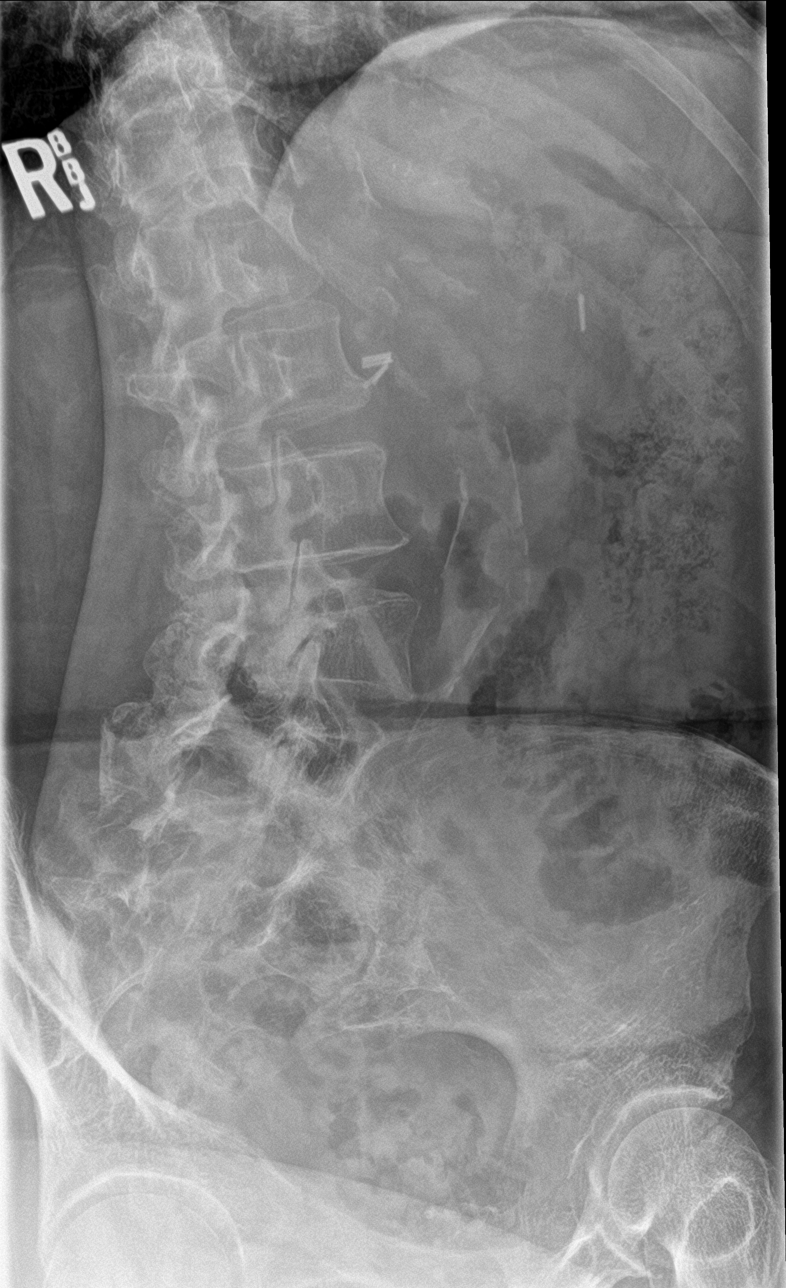

[l-spine lat]
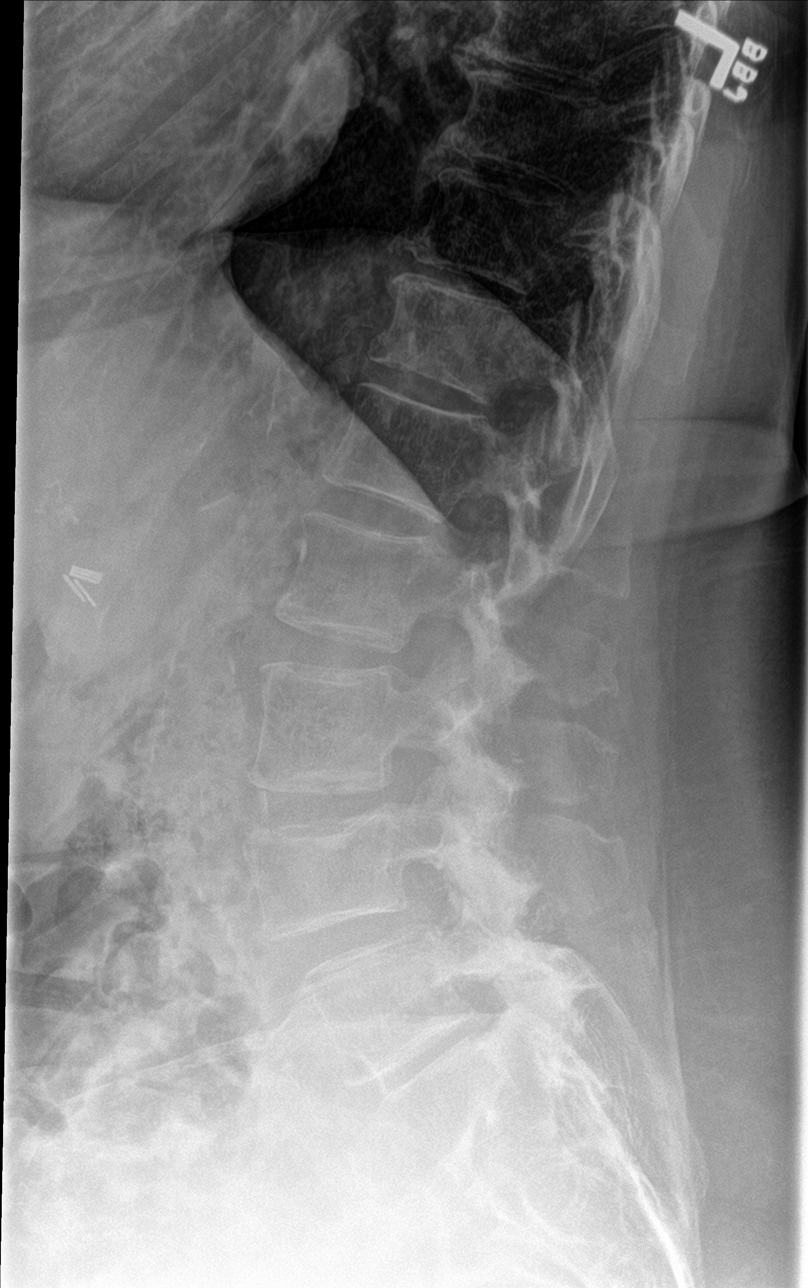

[l-spine spot]
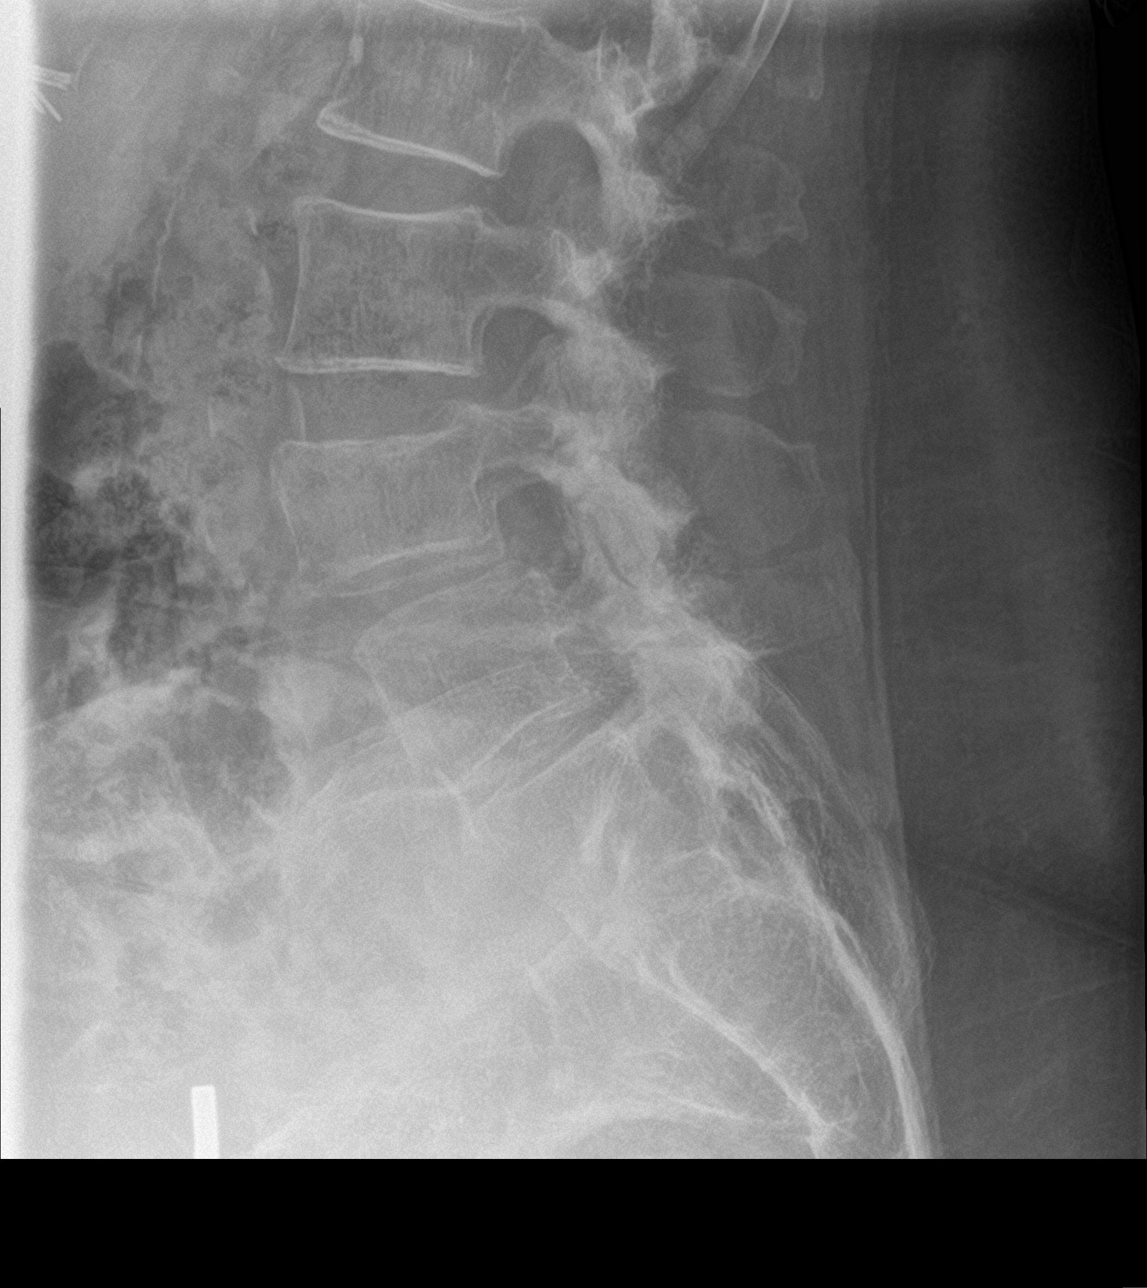

[5 of 5 positions shown; findings below may reference images not displayed]

FINDINGS: Mild loss of height of the L4 vertebrae with depression of the upper
endplate: Suspect reflect an acute fracture. Slight loss vertebral
height at T12, possibly an additional recent fracture versus a
chronic finding, the latter suspected.

No other evidence of a fracture.

Grade 1 anterolisthesis of L4 on L5.  No other spondylolisthesis.

Mild loss of disc height at L4-L5 with moderate loss of disc height
at L5-S1. There are facet degenerative changes in the lower lumbar
spine.

Skeletal structures are diffusely demineralized.

There are scattered aortic atherosclerotic calcifications.
IMPRESSION: 1. Mild compression fracture of L4, suspected to be acute/recent.
2. Minor loss of vertebral body height at T12, possibly an
additional recent fracture, but more likely chronic.
3. No other evidence of an acute or recent abnormality. Degenerative
changes as detailed.

## 2022-12-20 ENCOUNTER — Ambulatory Visit: Payer: Medicare Other | Admitting: Nutrition

## 2023-10-03 ENCOUNTER — Other Ambulatory Visit (HOSPITAL_COMMUNITY): Payer: Self-pay | Admitting: Family Medicine

## 2023-10-03 DIAGNOSIS — M810 Age-related osteoporosis without current pathological fracture: Secondary | ICD-10-CM

## 2023-10-03 DIAGNOSIS — M8580 Other specified disorders of bone density and structure, unspecified site: Secondary | ICD-10-CM

## 2023-10-03 DIAGNOSIS — Z1231 Encounter for screening mammogram for malignant neoplasm of breast: Secondary | ICD-10-CM

## 2023-10-17 ENCOUNTER — Ambulatory Visit (HOSPITAL_COMMUNITY)
Admission: RE | Admit: 2023-10-17 | Discharge: 2023-10-17 | Disposition: A | Discharge status: Home / Self Care | Source: Ambulatory Visit | Attending: Family Medicine | Admitting: Family Medicine

## 2023-10-17 ENCOUNTER — Encounter (HOSPITAL_COMMUNITY): Payer: Self-pay

## 2023-10-17 DIAGNOSIS — M810 Age-related osteoporosis without current pathological fracture: Secondary | ICD-10-CM | POA: Insufficient documentation

## 2023-10-17 DIAGNOSIS — Z1231 Encounter for screening mammogram for malignant neoplasm of breast: Secondary | ICD-10-CM | POA: Insufficient documentation

## 2023-10-17 DIAGNOSIS — M8580 Other specified disorders of bone density and structure, unspecified site: Secondary | ICD-10-CM | POA: Diagnosis present
